# Patient Record
Sex: Male | Born: 1960 | Race: Black or African American | Hispanic: No | Marital: Married | Smoking: Never smoker
Health system: Southern US, Community
[De-identification: ages and names within clinical notes are randomized; demographics above are authoritative.]

## PROBLEM LIST (undated history)

## (undated) DIAGNOSIS — E119 Type 2 diabetes mellitus without complications: Secondary | ICD-10-CM

---

## 2019-04-24 ENCOUNTER — Encounter: Payer: Self-pay | Admitting: Emergency Medicine

## 2019-04-24 ENCOUNTER — Emergency Department
Admission: EM | Admit: 2019-04-24 | Discharge: 2019-04-24 | Disposition: A | Discharge status: Home / Self Care | Payer: Medicaid Other | Attending: Emergency Medicine | Admitting: Emergency Medicine

## 2019-04-24 DIAGNOSIS — R519 Headache, unspecified: Secondary | ICD-10-CM | POA: Diagnosis not present

## 2019-04-24 DIAGNOSIS — R42 Dizziness and giddiness: Secondary | ICD-10-CM | POA: Insufficient documentation

## 2019-04-24 DIAGNOSIS — Z760 Encounter for issue of repeat prescription: Secondary | ICD-10-CM

## 2019-04-24 DIAGNOSIS — E119 Type 2 diabetes mellitus without complications: Secondary | ICD-10-CM | POA: Diagnosis not present

## 2019-04-24 HISTORY — DX: Type 2 diabetes mellitus without complications: E11.9

## 2019-04-24 LAB — BASIC METABOLIC PANEL
Anion gap: 7 (ref 5–15)
BUN: 20 mg/dL (ref 6–20)
CO2: 24 mmol/L (ref 22–32)
Calcium: 9.1 mg/dL (ref 8.9–10.3)
Chloride: 107 mmol/L (ref 98–111)
Creatinine, Ser: 0.94 mg/dL (ref 0.61–1.24)
GFR calc Af Amer: >60 mL/min (ref >60)
GFR calc non Af Amer: >60 mL/min (ref >60)
Glucose, Bld: 274 mg/dL — ABNORMAL HIGH (ref 70–99)
Potassium: 4.2 mmol/L (ref 3.5–5.1)
Sodium: 138 mmol/L (ref 135–145)

## 2019-04-24 LAB — CBC
HCT: 41.8 % (ref 39.0–52.0)
Hemoglobin: 13.8 g/dL (ref 13.0–17.0)
MCH: 29.6 pg (ref 26.0–34.0)
MCHC: 33 g/dL (ref 30.0–36.0)
MCV: 89.7 fL (ref 80.0–100.0)
Platelets: 291 10*3/uL (ref 150–400)
RBC: 4.66 MIL/uL (ref 4.22–5.81)
RDW: 12.5 % (ref 11.5–15.5)
WBC: 5.5 10*3/uL (ref 4.0–10.5)
nRBC: 0 % (ref 0.0–0.2)

## 2019-04-24 MED ORDER — KETOROLAC TROMETHAMINE 30 MG/ML IJ SOLN
30.0000 mg | Freq: Once | INTRAMUSCULAR | Status: AC
  Administered 2019-04-24: 30 mg via INTRAVENOUS
  Filled 2019-04-24: qty 1

## 2019-04-24 MED ORDER — INSULIN LISPRO 100 UNIT/ML ~~LOC~~ SOLN: 20.0000 [IU] | Freq: Three times a day (TID) | SUBCUTANEOUS | 2 refills | Status: AC

## 2019-04-24 MED ORDER — SODIUM CHLORIDE 0.9 % IV SOLN
Freq: Once | INTRAVENOUS | Status: AC
  Administered 2019-04-24: 09:00:00 via INTRAVENOUS

## 2019-04-24 MED ORDER — METOCLOPRAMIDE HCL 5 MG/ML IJ SOLN
10.0000 mg | Freq: Once | INTRAMUSCULAR | Status: AC
  Administered 2019-04-24: 10 mg via INTRAVENOUS
  Filled 2019-04-24: qty 2

## 2019-04-24 MED ORDER — BASAGLAR KWIKPEN 100 UNIT/ML ~~LOC~~ SOPN: 20.0000 [IU] | PEN_INJECTOR | Freq: Two times a day (BID) | SUBCUTANEOUS | 2 refills | Status: AC

## 2019-04-24 MED ORDER — DORZOLAMIDE HCL-TIMOLOL MAL 2-0.5 % OP SOLN: 1.0000 [drp] | Freq: Three times a day (TID) | OPHTHALMIC | 1 refills | Status: AC

## 2019-04-24 MED ORDER — ATORVASTATIN CALCIUM 40 MG PO TABS: 40.0000 mg | ORAL_TABLET | Freq: Every day | ORAL | 1 refills | Status: AC

## 2019-04-24 MED ORDER — DIPHENHYDRAMINE HCL 50 MG/ML IJ SOLN
25.0000 mg | Freq: Once | INTRAMUSCULAR | Status: AC
  Administered 2019-04-24: 25 mg via INTRAVENOUS
  Filled 2019-04-24: qty 1

## 2019-04-24 MED ORDER — OMEPRAZOLE 20 MG PO CPDR: 20.0000 mg | DELAYED_RELEASE_CAPSULE | Freq: Every day | ORAL | 1 refills | Status: AC

## 2019-04-24 MED ORDER — TAMSULOSIN HCL 0.4 MG PO CAPS: 0.4000 mg | ORAL_CAPSULE | Freq: Every day | ORAL | 2 refills | Status: AC

## 2019-04-24 MED ORDER — LISINOPRIL-HYDROCHLOROTHIAZIDE 20-25 MG PO TABS: 1.0000 | ORAL_TABLET | Freq: Every day | ORAL | 2 refills | Status: AC

## 2019-04-24 NOTE — ED Notes (Signed)
Re-collect of green top tube , sent to lab

## 2019-04-24 NOTE — ED Triage Notes (Signed)
Pt from home via EMS with c/o dizziness and headache. Hx of diabetes,  cbg 227. Pt is A&OX4, appears fatigue.

## 2019-04-24 NOTE — ED Provider Notes (Signed)
[facility] Emergency Department Provider Note       Time seen: ----------------------------------------- 8:14 AM on 04/24/2019 -----------------------------------------   I have reviewed the triage vital signs and the nursing notes.  HISTORY   Chief Complaint Dizziness   HPI Lawrence Buchanan is a 58 y.o. male with a history of diabetes who presents to the ED for dizziness and headache.  Patient reportedly has 10 out of 10 headache.  Patient states he stopped taking his medications for almost a week, then took them all last night.  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  He is complaining of headache and dizziness, feels weak and lightheaded.  Blood sugar of 227 here is low for him he states.  Past Medical History:  Diagnosis Date  . Diabetes mellitus without complication ([place])     There are no active problems to display for this patient.   History reviewed. No pertinent surgical history.  Allergies Patient has no known allergies.  Social History Social History   Tobacco Use  . Smoking status: Never Smoker  . Smokeless tobacco: Never Used  Substance Use Topics  . Alcohol use: Not Currently  . Drug use: Not Currently   Review of Systems Constitutional: Negative for fever. Cardiovascular: Negative for chest pain. Respiratory: Negative for shortness of breath. Gastrointestinal: Negative for abdominal pain, vomiting and diarrhea. Musculoskeletal: Negative for back pain. Skin: Negative for rash. Neurological: Positive for headache and weakness  All systems negative/normal/unremarkable except as stated in the HPI  ____________________________________________   PHYSICAL EXAM:  VITAL SIGNS: ED Triage Vitals  Enc Vitals Group     BP 04/24/19 0718 (!) 156/85     Pulse Rate 04/24/19 0718 62     Resp 04/24/19 0718 16     Temp 04/24/19 0718 98 F (36.7 C)     Temp Source 04/24/19 0718 Oral     SpO2  04/24/19 0718 100 %     Weight --      Height --      Head Circumference --      Peak Flow --      Pain Score 04/24/19 0716 10     Pain Loc --      Pain Edu? --      Excl. in [place]? --    Constitutional: Alert and oriented. Well appearing and in no distress. Eyes: Conjunctivae are normal. Normal extraocular movements. ENT      Head: Normocephalic and atraumatic.      Nose: No congestion/rhinnorhea.      Mouth/Throat: Mucous membranes are moist.      Neck: No stridor. Cardiovascular: Normal rate, regular rhythm. No murmurs, rubs, or gallops. Respiratory: Normal respiratory effort without tachypnea nor retractions. Breath sounds are clear and equal bilaterally. No wheezes/rales/rhonchi. Gastrointestinal: Soft and nontender. Normal bowel sounds Musculoskeletal: Nontender with normal range of motion in extremities. No lower extremity tenderness nor edema. Neurologic:  Normal speech and language. No gross focal neurologic deficits are appreciated.  Generalized weakness, nothing focal Skin:  Skin is warm, dry and intact. No rash noted. Psychiatric: Mood and affect are normal. Speech and behavior are normal.  ____________________________________________  EKG: Interpreted by me.  Sinus rhythm rate of 62 bpm, normal PR interval, normal QRS, normal QT  ____________________________________________  ED COURSE:  As part of my medical decision making, I reviewed the following data within the [facility] History obtained from family if  available, nursing notes, old chart and ekg, as well as notes from prior ED visits. Patient presented for dizziness and headache, we will assess with labs as indicated at this time.   Procedures  Onie Hayashi was evaluated in Emergency Department on 04/24/2019 for the symptoms described in the history of present illness. He was evaluated in the context of the global COVID-19 pandemic, which necessitated consideration that the patient might be at risk  for infection with the SARS-CoV-2 virus that causes COVID-19. Institutional protocols and algorithms that pertain to the evaluation of patients at risk for COVID-19 are in a state of rapid change based on information released by regulatory bodies including the CDC and federal and state organizations. These policies and algorithms were followed during the patient's care in the ED.  ____________________________________________   LABS (pertinent positives/negatives)  Labs Reviewed  BASIC METABOLIC PANEL - Abnormal; Notable for the following components:      Result Value   Glucose, Bld 274 (*)    All other components within normal limits  CBC  URINALYSIS, COMPLETE (UACMP) WITH MICROSCOPIC  CBG MONITORING, ED   ____________________________________________   DIFFERENTIAL DIAGNOSIS   Dehydration, electrolyte abnormality, hyperglycemia, hypoglycemia, renal failure, migraine, tension headache, subarachnoid hemorrhage  FINAL ASSESSMENT AND PLAN  Dizziness, headache   Plan: The patient had presented for dizziness and headache. Patient's labs did not reveal any acute process.  Blood sugar was 274.  Headache resolved with conservative management and he felt better.  Unfortunately he has run out of most of his medications and we will restart his medications and try to refill them.  He is cleared for outpatient follow-up.   Ulice Dash, MD    Note: This note was generated in part or whole with voice recognition software. Voice recognition is usually quite accurate but there are transcription errors that can and very often do occur. I apologize for any typographical errors that were not detected and corrected.     Emily Filbert, MD 04/24/19 1136

## 2019-11-22 ENCOUNTER — Ambulatory Visit: Payer: Medicaid Other | Admitting: Podiatry

## 2019-11-26 ENCOUNTER — Ambulatory Visit: Payer: Medicaid Other | Admitting: Podiatry

## 2019-11-26 ENCOUNTER — Other Ambulatory Visit: Payer: Self-pay

## 2019-11-26 ENCOUNTER — Encounter: Payer: Self-pay | Admitting: Podiatry

## 2019-11-26 ENCOUNTER — Encounter: Payer: Self-pay | Admitting: *Deleted

## 2019-11-26 DIAGNOSIS — E119 Type 2 diabetes mellitus without complications: Secondary | ICD-10-CM | POA: Insufficient documentation

## 2019-11-26 DIAGNOSIS — M2041 Other hammer toe(s) (acquired), right foot: Secondary | ICD-10-CM | POA: Diagnosis not present

## 2019-11-26 DIAGNOSIS — E1140 Type 2 diabetes mellitus with diabetic neuropathy, unspecified: Secondary | ICD-10-CM | POA: Diagnosis not present

## 2019-11-26 NOTE — Progress Notes (Addendum)
This patient presents to the office with chief complaint of  diabetic feet.  This patient  says there  is tingling  in his  feet.  Patient presents to the office with his wife for annual foot exam..  Patient has no history of infection or drainage from both feet.   This patient presents  to the office today for evaluation of his feet due to diabetes.   General Appearance  Alert, conversant and in no acute stress.  Vascular  Dorsalis pedis and posterior tibial  pulses are palpable  bilaterally.  Capillary return is within normal limits  bilaterally. Temperature is within normal limits  bilaterally.  Neurologic  Senn-Weinstein monofilament wire test within normal limits  bilaterally. Muscle power within normal limits bilaterally.  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  Orthopedic  No limitations of motion of motion feet .  No crepitus or effusions noted.  Pes planus foot type  B/L  Hallux limitus 1st MPJ  B/L.  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  Skin  normotropic skin with no porokeratosis noted bilaterally.  No signs of infections or ulcers noted.       Diabetes with no foot complications  IE    A diabetic foot exam was performed and there is no evidence of any vascular or neurologic pathology.  Told him about his nail dystrophy and told him to d/c lamisil until nail studies are done. Discussed his hallux limitus 1st MPJ  B/L with hammer toes.   RTC 1 year for annual diabetic exam.   Helane Gunther DPM

## 2020-01-30 DIAGNOSIS — Z0000 Encounter for general adult medical examination without abnormal findings: Secondary | ICD-10-CM | POA: Insufficient documentation

## 2020-01-30 DIAGNOSIS — B351 Tinea unguium: Secondary | ICD-10-CM | POA: Insufficient documentation

## 2020-01-30 DIAGNOSIS — H543 Unqualified visual loss, both eyes: Secondary | ICD-10-CM | POA: Insufficient documentation

## 2020-01-30 DIAGNOSIS — S3994XA Unspecified injury of external genitals, initial encounter: Secondary | ICD-10-CM | POA: Insufficient documentation

## 2020-01-30 DIAGNOSIS — Z6831 Body mass index (BMI) 31.0-31.9, adult: Secondary | ICD-10-CM | POA: Insufficient documentation

## 2020-03-10 ENCOUNTER — Other Ambulatory Visit: Payer: Self-pay | Admitting: Emergency Medicine

## 2020-03-13 ENCOUNTER — Other Ambulatory Visit: Payer: Self-pay | Admitting: Emergency Medicine

## 2020-04-18 ENCOUNTER — Other Ambulatory Visit: Payer: Self-pay

## 2020-04-18 ENCOUNTER — Ambulatory Visit: Payer: Medicaid Other | Admitting: Urology

## 2020-06-30 ENCOUNTER — Other Ambulatory Visit: Payer: Self-pay

## 2020-06-30 ENCOUNTER — Encounter: Payer: Self-pay | Admitting: Emergency Medicine

## 2020-06-30 ENCOUNTER — Emergency Department: Payer: Medicaid Other

## 2020-06-30 DIAGNOSIS — R079 Chest pain, unspecified: Secondary | ICD-10-CM | POA: Insufficient documentation

## 2020-06-30 DIAGNOSIS — Z5321 Procedure and treatment not carried out due to patient leaving prior to being seen by health care provider: Secondary | ICD-10-CM | POA: Insufficient documentation

## 2020-06-30 DIAGNOSIS — R0602 Shortness of breath: Secondary | ICD-10-CM | POA: Diagnosis not present

## 2020-06-30 NOTE — ED Triage Notes (Signed)
Pt to triage via w/c with no distress noted; pt reports mid CP tonight, nonradiating accomp by [facility]

## 2020-07-01 ENCOUNTER — Emergency Department
Admission: EM | Admit: 2020-07-01 | Discharge: 2020-07-01 | Disposition: A | Discharge status: Home / Self Care | Payer: Medicaid Other | Attending: Emergency Medicine | Admitting: Emergency Medicine

## 2020-07-01 LAB — CBC WITH DIFFERENTIAL/PLATELET
Abs Immature Granulocytes: 0.06 10*3/uL (ref 0.00–0.07)
Basophils Absolute: 0 10*3/uL (ref 0.0–0.1)
Basophils Relative: 0 %
Eosinophils Absolute: 0.2 10*3/uL (ref 0.0–0.5)
Eosinophils Relative: 1 %
HCT: 49.4 % (ref 39.0–52.0)
Hemoglobin: 16.8 g/dL (ref 13.0–17.0)
Immature Granulocytes: 1 %
Lymphocytes Relative: 75 %
Lymphs Abs: 9.7 10*3/uL — ABNORMAL HIGH (ref 0.7–4.0)
MCH: 29.5 pg (ref 26.0–34.0)
MCHC: 34 g/dL (ref 30.0–36.0)
MCV: 86.8 fL (ref 80.0–100.0)
Monocytes Absolute: 0.9 10*3/uL (ref 0.1–1.0)
Monocytes Relative: 7 %
Neutro Abs: 2.1 10*3/uL (ref 1.7–7.7)
Neutrophils Relative %: 16 %
Platelets: 413 10*3/uL — ABNORMAL HIGH (ref 150–400)
RBC: 5.69 MIL/uL (ref 4.22–5.81)
RDW: 11.9 % (ref 11.5–15.5)
Smear Review: NORMAL
WBC: 13 10*3/uL — ABNORMAL HIGH (ref 4.0–10.5)
nRBC: 0 % (ref 0.0–0.2)

## 2020-07-01 LAB — COMPREHENSIVE METABOLIC PANEL
ALT: 42 U/L (ref 0–44)
AST: 33 U/L (ref 15–41)
Albumin: 4.1 g/dL (ref 3.5–5.0)
Alkaline Phosphatase: 136 U/L — ABNORMAL HIGH (ref 38–126)
Anion gap: 16 — ABNORMAL HIGH (ref 5–15)
BUN: 25 mg/dL — ABNORMAL HIGH (ref 6–20)
CO2: 20 mmol/L — ABNORMAL LOW (ref 22–32)
Calcium: 9.7 mg/dL (ref 8.9–10.3)
Chloride: 103 mmol/L (ref 98–111)
Creatinine, Ser: 1.39 mg/dL — ABNORMAL HIGH (ref 0.61–1.24)
GFR, Estimated: 59 mL/min — ABNORMAL LOW (ref >60)
Glucose, Bld: 88 mg/dL (ref 70–99)
Potassium: 3.6 mmol/L (ref 3.5–5.1)
Sodium: 139 mmol/L (ref 135–145)
Total Bilirubin: 0.6 mg/dL (ref 0.3–1.2)
Total Protein: 8.4 g/dL — ABNORMAL HIGH (ref 6.5–8.1)

## 2020-07-01 LAB — TROPONIN I (HIGH SENSITIVITY): Troponin I (High Sensitivity): 11 ng/L (ref <18)

## 2020-07-01 LAB — PATHOLOGIST SMEAR REVIEW

## 2020-08-01 ENCOUNTER — Ambulatory Visit
Admission: RE | Admit: 2020-08-01 | Discharge: 2020-08-01 | Disposition: A | Discharge status: Home / Self Care | Payer: Disability Insurance | Attending: Family Medicine | Admitting: Family Medicine

## 2020-08-01 ENCOUNTER — Other Ambulatory Visit: Payer: Self-pay | Admitting: Family Medicine

## 2020-08-01 ENCOUNTER — Ambulatory Visit
Admission: RE | Admit: 2020-08-01 | Discharge: 2020-08-01 | Disposition: A | Discharge status: Home / Self Care | Payer: Disability Insurance | Source: Ambulatory Visit | Attending: Family Medicine | Admitting: Family Medicine

## 2020-08-01 DIAGNOSIS — M1990 Unspecified osteoarthritis, unspecified site: Secondary | ICD-10-CM

## 2020-10-21 ENCOUNTER — Emergency Department
Admission: EM | Admit: 2020-10-21 | Discharge: 2020-10-22 | Disposition: A | Discharge status: Home / Self Care | Payer: Medicaid Other | Attending: Emergency Medicine | Admitting: Emergency Medicine

## 2020-10-21 ENCOUNTER — Other Ambulatory Visit: Payer: Self-pay

## 2020-10-21 ENCOUNTER — Emergency Department: Payer: Medicaid Other

## 2020-10-21 ENCOUNTER — Encounter: Payer: Self-pay | Admitting: Emergency Medicine

## 2020-10-21 ENCOUNTER — Ambulatory Visit
Admission: EM | Admit: 2020-10-21 | Discharge: 2020-10-21 | Disposition: A | Discharge status: Home / Self Care | Payer: Medicaid Other | Attending: Sports Medicine | Admitting: Sports Medicine

## 2020-10-21 DIAGNOSIS — Z20822 Contact with and (suspected) exposure to covid-19: Secondary | ICD-10-CM | POA: Insufficient documentation

## 2020-10-21 DIAGNOSIS — R1084 Generalized abdominal pain: Secondary | ICD-10-CM | POA: Insufficient documentation

## 2020-10-21 DIAGNOSIS — R55 Syncope and collapse: Secondary | ICD-10-CM | POA: Insufficient documentation

## 2020-10-21 DIAGNOSIS — R45851 Suicidal ideations: Secondary | ICD-10-CM | POA: Diagnosis not present

## 2020-10-21 DIAGNOSIS — R059 Cough, unspecified: Secondary | ICD-10-CM | POA: Diagnosis not present

## 2020-10-21 DIAGNOSIS — Z79899 Other long term (current) drug therapy: Secondary | ICD-10-CM | POA: Diagnosis not present

## 2020-10-21 DIAGNOSIS — R739 Hyperglycemia, unspecified: Secondary | ICD-10-CM

## 2020-10-21 DIAGNOSIS — F321 Major depressive disorder, single episode, moderate: Secondary | ICD-10-CM

## 2020-10-21 DIAGNOSIS — E860 Dehydration: Secondary | ICD-10-CM | POA: Insufficient documentation

## 2020-10-21 DIAGNOSIS — E1165 Type 2 diabetes mellitus with hyperglycemia: Secondary | ICD-10-CM | POA: Insufficient documentation

## 2020-10-21 DIAGNOSIS — R42 Dizziness and giddiness: Secondary | ICD-10-CM | POA: Insufficient documentation

## 2020-10-21 DIAGNOSIS — M62838 Other muscle spasm: Secondary | ICD-10-CM | POA: Diagnosis not present

## 2020-10-21 DIAGNOSIS — F32A Depression, unspecified: Secondary | ICD-10-CM | POA: Insufficient documentation

## 2020-10-21 DIAGNOSIS — R410 Disorientation, unspecified: Secondary | ICD-10-CM | POA: Insufficient documentation

## 2020-10-21 DIAGNOSIS — R519 Headache, unspecified: Secondary | ICD-10-CM | POA: Insufficient documentation

## 2020-10-21 LAB — CBC WITH DIFFERENTIAL/PLATELET
Abs Immature Granulocytes: 0.02 10*3/uL (ref 0.00–0.07)
Basophils Absolute: 0 10*3/uL (ref 0.0–0.1)
Basophils Relative: 0 %
Eosinophils Absolute: 0 10*3/uL (ref 0.0–0.5)
Eosinophils Relative: 0 %
HCT: 45 % (ref 39.0–52.0)
Hemoglobin: 15.7 g/dL (ref 13.0–17.0)
Immature Granulocytes: 0 %
Lymphocytes Relative: 28 %
Lymphs Abs: 2.6 10*3/uL (ref 0.7–4.0)
MCH: 29.5 pg (ref 26.0–34.0)
MCHC: 34.9 g/dL (ref 30.0–36.0)
MCV: 84.6 fL (ref 80.0–100.0)
Monocytes Absolute: 0.5 10*3/uL (ref 0.1–1.0)
Monocytes Relative: 5 %
Neutro Abs: 6.3 10*3/uL (ref 1.7–7.7)
Neutrophils Relative %: 67 %
Platelets: 323 10*3/uL (ref 150–400)
RBC: 5.32 MIL/uL (ref 4.22–5.81)
RDW: 12.5 % (ref 11.5–15.5)
WBC: 9.4 10*3/uL (ref 4.0–10.5)
nRBC: 0 % (ref 0.0–0.2)

## 2020-10-21 LAB — GLUCOSE, CAPILLARY: Glucose-Capillary: 181 mg/dL — ABNORMAL HIGH (ref 70–99)

## 2020-10-21 LAB — URINALYSIS, COMPLETE (UACMP) WITH MICROSCOPIC
Bacteria, UA: NONE SEEN
Bilirubin Urine: NEGATIVE
Glucose, UA: >=500 mg/dL — AB
Hgb urine dipstick: NEGATIVE
Ketones, ur: 5 mg/dL — AB
Leukocytes,Ua: NEGATIVE
Nitrite: NEGATIVE
Protein, ur: NEGATIVE mg/dL
Specific Gravity, Urine: 1.022 (ref 1.005–1.030)
Squamous Epithelial / HPF: NONE SEEN (ref 0–5)
pH: 5 (ref 5.0–8.0)

## 2020-10-21 LAB — ETHANOL: Alcohol, Ethyl (B): <10 mg/dL (ref <10)

## 2020-10-21 LAB — COMPREHENSIVE METABOLIC PANEL
ALT: 30 U/L (ref 0–44)
AST: 20 U/L (ref 15–41)
Albumin: 4.4 g/dL (ref 3.5–5.0)
Alkaline Phosphatase: 82 U/L (ref 38–126)
Anion gap: 12 (ref 5–15)
BUN: 31 mg/dL — ABNORMAL HIGH (ref 6–20)
CO2: 19 mmol/L — ABNORMAL LOW (ref 22–32)
Calcium: 9.7 mg/dL (ref 8.9–10.3)
Chloride: 105 mmol/L (ref 98–111)
Creatinine, Ser: 1.32 mg/dL — ABNORMAL HIGH (ref 0.61–1.24)
GFR, Estimated: >60 mL/min (ref >60)
Glucose, Bld: 217 mg/dL — ABNORMAL HIGH (ref 70–99)
Potassium: 3.9 mmol/L (ref 3.5–5.1)
Sodium: 136 mmol/L (ref 135–145)
Total Bilirubin: 0.9 mg/dL (ref 0.3–1.2)
Total Protein: 8.2 g/dL — ABNORMAL HIGH (ref 6.5–8.1)

## 2020-10-21 LAB — TROPONIN I (HIGH SENSITIVITY)
Troponin I (High Sensitivity): 7 ng/L (ref <18)
Troponin I (High Sensitivity): 8 ng/L (ref <18)

## 2020-10-21 LAB — URINE DRUG SCREEN, QUALITATIVE (ARMC ONLY)
Amphetamines, Ur Screen: NOT DETECTED
Barbiturates, Ur Screen: NOT DETECTED
Benzodiazepine, Ur Scrn: NOT DETECTED
Cannabinoid 50 Ng, Ur ~~LOC~~: NOT DETECTED
Cocaine Metabolite,Ur ~~LOC~~: NOT DETECTED
MDMA (Ecstasy)Ur Screen: NOT DETECTED
Methadone Scn, Ur: NOT DETECTED
Opiate, Ur Screen: NOT DETECTED
Phencyclidine (PCP) Ur S: NOT DETECTED
Tricyclic, Ur Screen: NOT DETECTED

## 2020-10-21 LAB — MAGNESIUM: Magnesium: 2.3 mg/dL (ref 1.7–2.4)

## 2020-10-21 LAB — RESP PANEL BY RT-PCR (FLU A&B, COVID) ARPGX2
Influenza A by PCR: NEGATIVE
Influenza B by PCR: NEGATIVE
SARS Coronavirus 2 by RT PCR: NEGATIVE

## 2020-10-21 LAB — SALICYLATE LEVEL: Salicylate Lvl: <7 mg/dL — ABNORMAL LOW (ref 7.0–30.0)

## 2020-10-21 LAB — ACETAMINOPHEN LEVEL: Acetaminophen (Tylenol), Serum: <10 ug/mL — ABNORMAL LOW (ref 10–30)

## 2020-10-21 LAB — CBG MONITORING, ED
Glucose-Capillary: 274 mg/dL — ABNORMAL HIGH (ref 70–99)
Glucose-Capillary: 306 mg/dL — ABNORMAL HIGH (ref 70–99)

## 2020-10-21 LAB — CK: Total CK: 157 U/L (ref 49–397)

## 2020-10-21 LAB — HEMOGLOBIN A1C
Hgb A1c MFr Bld: 13.8 % — ABNORMAL HIGH (ref 4.8–5.6)
Mean Plasma Glucose: 349.36 mg/dL

## 2020-10-21 MED ORDER — BRIMONIDINE TARTRATE 0.2 % OP SOLN
1.0000 [drp] | Freq: Three times a day (TID) | OPHTHALMIC | Status: DC
  Administered 2020-10-21 – 2020-10-22 (×2): 1 [drp] via OPHTHALMIC
  Filled 2020-10-21: qty 5

## 2020-10-21 MED ORDER — DORZOLAMIDE HCL-TIMOLOL MAL 2-0.5 % OP SOLN
1.0000 [drp] | Freq: Three times a day (TID) | OPHTHALMIC | Status: DC
  Administered 2020-10-21 – 2020-10-22 (×2): 1 [drp] via OPHTHALMIC
  Filled 2020-10-21: qty 10

## 2020-10-21 MED ORDER — LISINOPRIL-HYDROCHLOROTHIAZIDE 20-25 MG PO TABS: 1.0000 | ORAL_TABLET | Freq: Every day | ORAL | Status: DC

## 2020-10-21 MED ORDER — INSULIN ASPART 100 UNIT/ML IJ SOLN
0.0000 [IU] | INTRAMUSCULAR | Status: DC
  Administered 2020-10-21: 11 [IU] via SUBCUTANEOUS
  Administered 2020-10-21: 8 [IU] via SUBCUTANEOUS
  Administered 2020-10-22 (×2): 5 [IU] via SUBCUTANEOUS
  Administered 2020-10-22: 11 [IU] via SUBCUTANEOUS
  Filled 2020-10-21 (×4): qty 1

## 2020-10-21 MED ORDER — ACETAMINOPHEN 325 MG PO TABS: 650.0000 mg | ORAL_TABLET | ORAL | Status: DC | PRN

## 2020-10-21 MED ORDER — LISINOPRIL 20 MG PO TABS
20.0000 mg | ORAL_TABLET | Freq: Every day | ORAL | Status: DC
  Administered 2020-10-21 – 2020-10-22 (×2): 20 mg via ORAL
  Filled 2020-10-21: qty 1
  Filled 2020-10-21: qty 2

## 2020-10-21 MED ORDER — LACTATED RINGERS IV BOLUS
1000.0000 mL | Freq: Once | INTRAVENOUS | Status: AC
  Administered 2020-10-21: 1000 mL via INTRAVENOUS

## 2020-10-21 MED ORDER — LORAZEPAM 2 MG/ML IJ SOLN
1.0000 mg | Freq: Once | INTRAMUSCULAR | Status: AC
  Administered 2020-10-21: 1 mg via INTRAVENOUS
  Filled 2020-10-21: qty 1

## 2020-10-21 MED ORDER — ATORVASTATIN CALCIUM 20 MG PO TABS
40.0000 mg | ORAL_TABLET | Freq: Every day | ORAL | Status: DC
  Administered 2020-10-21 (×2): 40 mg via ORAL
  Filled 2020-10-21 (×2): qty 2

## 2020-10-21 MED ORDER — HYDROCHLOROTHIAZIDE 25 MG PO TABS
25.0000 mg | ORAL_TABLET | Freq: Every day | ORAL | Status: DC
  Administered 2020-10-22: 25 mg via ORAL
  Filled 2020-10-21: qty 1

## 2020-10-21 MED ORDER — TAMSULOSIN HCL 0.4 MG PO CAPS
0.4000 mg | ORAL_CAPSULE | Freq: Every day | ORAL | Status: DC
  Administered 2020-10-21 – 2020-10-22 (×2): 0.4 mg via ORAL
  Filled 2020-10-21 (×2): qty 1

## 2020-10-21 MED ORDER — PANTOPRAZOLE SODIUM 40 MG PO TBEC
40.0000 mg | DELAYED_RELEASE_TABLET | Freq: Every day | ORAL | Status: DC
  Administered 2020-10-21 – 2020-10-22 (×2): 40 mg via ORAL
  Filled 2020-10-21 (×2): qty 1

## 2020-10-21 MED ORDER — LATANOPROST 0.005 % OP SOLN
1.0000 [drp] | Freq: Every day | OPHTHALMIC | Status: DC
  Administered 2020-10-21: 1 [drp] via OPHTHALMIC
  Filled 2020-10-21: qty 2.5

## 2020-10-21 NOTE — ED Provider Notes (Addendum)
[facility] Emergency Department Provider Note  ____________________________________________   [facility]   [facility] MD Initiated Contact with Patient 10/21/20 1710     (approximate)  I have reviewed the triage vital signs and the nursing notes.   HISTORY  Chief Complaint Abdominal Pain   HPI Lawrence Buchanan is a 60 y.o. male with a past medical history of diabetes who presents accompanied by his wife for assessment of several concerns.  He referred from urgent care.  Patient is a somewhat tangential and poor historian but it seems he started feeling bad over the last couple days with fairly severe abdominal cramps, chest pain, headache, cough, and dizziness.  He thinks he may have passed out a couple times today and while there was initially some concern for possible seizure patient thinks this is less likely and more likely to be passed out.  He has not had any tongue biting or incontinence.  He has no history of seizures.  He is not sure if his blood sugar was low or not.  No seizure-like activity but he was ever witnessed by family or caregivers urgent care.  Patient denies any recent injuries, illicit drug use, tobacco abuse, EtOH use.  Denies any fevers, hemoptysis, shortness of breath, back pain, pain with urination, diarrhea, vomiting, blood in stool or urine does think he has been peeing less than usual.  He states he feels achy and on his arms and legs does not have any focal extremity pain rashes joint swelling or other clear acute extremity pain.  No clear leaving aggravating factors.  Patient states he also feels extremely depressed and is "ready to leave".  He denies taking any substance to harm herself or active suicidal plans.         Past Medical History:  Diagnosis Date  . Diabetes mellitus without complication [facility](HCC)     Patient Active Problem List   Diagnosis Date Noted  . Diabetes (HCC) 11/26/2019  . Diabetes mellitus without complication  (HCC) 11/26/2019    History reviewed. No pertinent surgical history.  Prior to Admission medications   Medication Sig Start Date End Date Taking? Authorizing Provider  atorvastatin (LIPITOR) 40 MG tablet Take 1 tablet (40 mg total) by mouth at bedtime. 04/24/19   Emily FilbertWilliams, Jonathan E, MD  BAYER ASPIRIN EC LOW DOSE 81 MG EC tablet Take by mouth. 06/14/19   [provider]  brimonidine (ALPHAGAN) 0.2 % ophthalmic solution Place 1 drop into both eyes 3 (three) times daily.    [provider]  dorzolamide-timolol (COSOPT) 22.3-6.8 MG/ML ophthalmic solution Place 1 drop into both eyes 3 (three) times daily. 04/24/19   Emily FilbertWilliams, Jonathan E, MD  Insulin Glargine (BASAGLAR KWIKPEN) 100 UNIT/ML SOPN Inject 0.2 mLs (20 Units total) into the skin 2 (two) times daily. 04/24/19   Emily FilbertWilliams, Jonathan E, MD  insulin lispro (ADMELOG) 100 UNIT/ML injection Inject 0.2 mLs (20 Units total) into the skin 3 (three) times daily before meals. (according to sliding scale) 04/24/19   Emily FilbertWilliams, Jonathan E, MD  latanoprost (XALATAN) 0.005 % ophthalmic solution Place 1 drop into both eyes at bedtime.    [provider]  lisinopril-hydrochlorothiazide (ZESTORETIC) 20-25 MG tablet Take 1 tablet by mouth daily. 04/24/19   Emily FilbertWilliams, Jonathan E, MD  NOVOLOG FLEXPEN 100 UNIT/ML FlexPen Inject into the skin. 06/14/19   [provider]  omeprazole (PRILOSEC) 20 MG capsule Take 1 capsule (20 mg total) by mouth daily. 04/24/19   Emily FilbertWilliams, Jonathan E, MD  tamsulosin (FLOMAX) 0.4 MG CAPS capsule Take 1 capsule (0.4 mg total) by mouth daily. 04/24/19   Emily Filbert, MD  terbinafine (LAMISIL) 250 MG tablet Take by mouth. 06/14/19   [provider]    Allergies Patient has no known allergies.  History reviewed. No pertinent family history.  Social History Social History   Tobacco Use  . Smoking status: Never Smoker  . Smokeless tobacco: Never Used  Substance Use Topics  . Alcohol  use: Not Currently  . Drug use: Not Currently    Review of Systems  Review of Systems  Constitutional: Positive for malaise/fatigue. Negative for chills and fever.  HENT: Negative for sore throat.   Eyes: Negative for pain.  Respiratory: Negative for cough and stridor.   Cardiovascular: Positive for chest pain.  Gastrointestinal: Positive for abdominal pain. Negative for vomiting.  Genitourinary: Negative for dysuria.  Musculoskeletal: Positive for myalgias.  Skin: Negative for rash.  Neurological: Positive for dizziness and headaches. Negative for seizures and loss of consciousness.  Psychiatric/Behavioral: Positive for depression. Negative for suicidal ideas.  All other systems reviewed and are negative.     ____________________________________________   PHYSICAL EXAM:  VITAL SIGNS: ED Triage Vitals  Enc Vitals Group     BP      Pulse      Resp      Temp      Temp src      SpO2      Weight      Height      Head Circumference      Peak Flow      Pain Score      Pain Loc      Pain Edu?      Excl. in GC?    Vitals:   10/21/20 1726  BP: (!) 169/90  Pulse: 99  Resp: 20  Temp: 97.8 F (36.6 C)  SpO2: 100%   Physical Exam Vitals and nursing note reviewed.  Constitutional:      Appearance: He is well-developed. He is ill-appearing.  HENT:     Head: Normocephalic and atraumatic.     Right Ear: External ear normal.     Left Ear: External ear normal.     Nose: Nose normal.     Mouth/Throat:     Mouth: Mucous membranes are dry.  Eyes:     Conjunctiva/sclera: Conjunctivae normal.  Cardiovascular:     Rate and Rhythm: Normal rate and regular rhythm.     Heart sounds: No murmur heard.   Pulmonary:     Effort: Pulmonary effort is normal. No respiratory distress.     Breath sounds: Normal breath sounds.  Abdominal:     Palpations: Abdomen is soft.     Tenderness: There is generalized abdominal tenderness. There is no right CVA tenderness or left CVA  tenderness.  Musculoskeletal:     Cervical back: Neck supple.  Skin:    General: Skin is warm and dry.     Capillary Refill: Capillary refill takes 2 to 3 seconds.  Neurological:     Mental Status: He is alert and oriented to person, place, and time.  Psychiatric:        Mood and Affect: Mood is depressed.        Thought Content: Thought content includes suicidal ideation. Thought content does not include homicidal ideation. Thought content does not include homicidal or suicidal plan.     Cranial nerves II through XII grossly intact.  No pronator drift.  No finger dysmetria.  Symmetric 5/5 strength of all extremities.  Sensation intact to light touch in all extremities.    ____________________________________________   LABS (all labs ordered are listed, but only abnormal results are displayed)  Labs Reviewed  COMPREHENSIVE METABOLIC PANEL - Abnormal; Notable for the following components:      Result Value   CO2 19 (*)    Glucose, Bld 217 (*)    BUN 31 (*)    Creatinine, Ser 1.32 (*)    Total Protein 8.2 (*)    All other components within normal limits  URINALYSIS, COMPLETE (UACMP) WITH MICROSCOPIC - Abnormal; Notable for the following components:   Color, Urine YELLOW (*)    APPearance CLEAR (*)    Glucose, UA >=500 (*)    Ketones, ur 5 (*)    All other components within normal limits  HEMOGLOBIN A1C - Abnormal; Notable for the following components:   Hgb A1c MFr Bld 13.8 (*)    All other components within normal limits  RESP PANEL BY RT-PCR (FLU A&B, COVID) ARPGX2  CBC WITH DIFFERENTIAL/PLATELET  MAGNESIUM  URINE DRUG SCREEN, QUALITATIVE ([facility] ONLY)  ACETAMINOPHEN LEVEL  SALICYLATE LEVEL  ETHANOL  CK  TROPONIN I (HIGH SENSITIVITY)  TROPONIN I (HIGH SENSITIVITY)   ____________________________________________  EKG  Sinus rhythm with ventricular of 96, unremarkable intervals without clearance of acute ischemia or  significant Arrhythmia. ____________________________________________  RADIOLOGY  ED MD interpretation: Chest x-ray has no focal consolidation, effusion, significant edema, pneumothorax or any other clear acute thoracic process.  CT head shows no acute intracranial process.  Very remote left lamina papyracea fracture.  CT abdomen pelvis remarkable for some circumferential bladder wall thickening  Official radiology report(s): CT ABDOMEN PELVIS WO CONTRAST  Result Date: 10/21/2020 CLINICAL DATA:  Reported seizures, flushing abdomen, history of type 1 diabetes EXAM: CT ABDOMEN AND PELVIS WITHOUT CONTRAST TECHNIQUE: Multidetector CT imaging of the abdomen and pelvis was performed following the standard protocol without IV contrast. COMPARISON:  None. FINDINGS: Lower chest: Lung bases are clear. Normal heart size. No pericardial effusion. Hepatobiliary: No worrisome focal liver abnormality is seen. Normal gallbladder. No visible calcified gallstones. No biliary ductal dilatation. Pancreas: No pancreatic ductal dilatation or surrounding inflammatory changes. Spleen: Normal in size. No concerning splenic lesions. Adrenals/Urinary Tract: Normal adrenals. Kidneys are symmetric in size and normally located. Nonobstructive 3 mm calculus in the upper pole left kidney (5/60). No obstructive urolithiasis or hydronephrosis. No visible or contour deforming renal lesions. Circumferential bladder wall thickening. No visible bladder calculi or debris. Indentation of bladder base by an enlarged prostate. Stomach/Bowel: Distal esophagus, stomach and duodenal sweep are unremarkable. No small bowel wall thickening or dilatation. No evidence of obstruction. A normal appendix is visualized. No colonic dilatation or wall thickening. Vascular/Lymphatic: Atherosclerotic calcifications within the abdominal aorta and branch vessels. No aneurysm or ectasia. No enlarged abdominopelvic lymph nodes. Reproductive: Mild prostatomegaly with  few benign-appearing eccentric calcifications. Seminal vesicles are unremarkable. Included external genitalia are free of acute abnormality. Other: No abdominopelvic free fluid or free gas. No bowel containing hernias. Tiny fat containing umbilical hernia. Musculoskeletal: Multilevel degenerative changes are present in the imaged portions of the spine. Stepwise retrolisthesis L3-L5 without spondylolysis. Musculature is normal and symmetric. IMPRESSION: 1. Circumferential bladder wall thickening, possibly related to outlet obstruction given enlarged prostate with indentation of the bladder base. Though recommend correlation with urinalysis to exclude a cystitis. 2. Punctate nonobstructing calculus in the upper pole left kidney. 3. No other acute CT evident abdominopelvic  abnormality. Electronically Signed   By: Kreg Shropshire M.D.   On: 10/21/2020 18:19   CT Head Wo Contrast  Result Date: 10/21/2020 CLINICAL DATA:  Wife states patient has had 3 seizures today, clenching abdomen, history of type 1 diabetes EXAM: CT HEAD WITHOUT CONTRAST TECHNIQUE: Contiguous axial images were obtained from the base of the skull through the vertex without intravenous contrast. COMPARISON:  None. FINDINGS: Brain: No evidence of acute infarction, hemorrhage, hydrocephalus, extra-axial collection, visible mass lesion or mass effect. Vascular: No hyperdense vessel or unexpected calcification. Skull: No calvarial fracture or suspicious osseous lesion. No scalp swelling or hematoma. Remote appearing left lamina papyracea fracture. Sinuses/Orbits: Paranasal sinuses and mastoid air cells are predominantly clear. Middle ear cavities are clear. Orbital structures are unremarkable aside from prior lens extractions. Other: None. IMPRESSION: No acute intracranial abnormality. Remote appearing left lamina papyracea fracture. Electronically Signed   By: Kreg Shropshire M.D.   On: 10/21/2020 18:14   DG Chest Portable 1 View  Result Date:  10/21/2020 CLINICAL DATA:  Seizures. EXAM: PORTABLE CHEST 1 VIEW COMPARISON:  06/30/2020 FINDINGS: The cardiac silhouette, mediastinal and hilar contours are within normal limits given the AP projection and portable technique. The lungs are clear of an acute process. No pleural effusions or pneumothorax. The bony thorax is intact. IMPRESSION: No acute cardiopulmonary findings. Electronically Signed   By: Rudie Meyer M.D.   On: 10/21/2020 18:00    ____________________________________________   PROCEDURES  Procedure(s) performed (including Critical Care):  .1-3 Lead EKG Interpretation Performed by: Gilles Chiquito, MD Authorized by: Gilles Chiquito, MD     Interpretation: normal     ECG rate assessment: normal     Rhythm: sinus rhythm     Ectopy: none     Conduction: normal       ____________________________________________   INITIAL IMPRESSION / ASSESSMENT AND PLAN / ED COURSE      Patient presents with above-stated history and exam for assessment of multiple concerns including possible several passing out episodes today versus seizure in the setting of couple days of headache, dizziness, chest pain, abdominal pain, myalgias and fatigue.  Patient also endorses severe depression make several statements saying "I am ready to go its my time".  Denies any active plan to harm self or doing anything to harm self prior to arrival.  He is somewhat tangential historian but denies any other associated symptoms or recent illnesses.  Regard to his dizziness headache chest pain abdominal pain and myalgias differential includes acute infectious process, metabolic derangements, arrhythmias,, toxic ingestion possible seizure.  Upon initial exam patient seemed to have some spasming of his abdomen on abdominal exam following some fluids and Ativan he stated his abdominal pain has completely resolved.  His abdomen was soft and nontender throughout.  In addition he had a completely nonfocal supine  neuro exam.  CT head shows no evidence of clear acute intracranial process.  No focal deficits to suggest CVA.  No findings on history or exam to suggest deep space infection in head or neck.    With regard to patient's reported chest pain and cardiology at this time.  He was quite hypertensive on arrival although certainly possible he had some symptomatic hypertensive urgency although given resolution of discomfort with some IV fluids and antispasmodic without any other clear historical or exam features suggestive of dissection at this time.  He has no hypoxia, tachypnea, or risk factors for PE and overall Evalose patient for PE at this time.  Chest x-ray shows no evidence of pneumonia, thorax, effusion edema or other clear acute intrathoracic process.  ECG is no clear evidence of acute ischemia and given nonelevated troponin obtained greater than 3 hours after symptom onset I have a low suspicion for ACS or myocarditis contributing to patient's chest pain.  CBC shows no leukocytosis or acute anemia.  CMP shows bicarb of 19 and glucose of 217 with normal anion gap without any other significant electrolyte or metabolic derangements.  Magnesium is WNL.  Unclear source of possible syncope although certainly possible this was witnessed dehydration.  With regard to patient's abdominal pain and swelling possible secondary to spasm no symptoms bending my initial assessment.  He also reports some urinary hesitancy and has some thickening of his bladder suggestive of some chronic urinary retention but he is able to urinate without significant difficulty emergency room.  UA does not appear infected.  CT abdomen pelvis shows no other clear acute abdominal pelvic process i.e. cholecystitis, pancreatitis, diverticulitis or any other clear cause of patient's abdominal symptoms.  Feeling possible he had some spasm related to some dehydration he did appear dehydrated on arrival.  On several reassessments patient stated he  felt much better and denied any back pain, abdominal pain, chest pain and stated headache had resolved.  Given he endorsed fairly severe depression with multiple statements made about passive suicidal thoughts about not wanting to exist anymore and it is time to go psychiatry and TTS consulted.  We will keep patient voluntary at this time as he states he is interested in getting help with his depression.  Remainder of routine psych screening labs undeliverable suspicion for toxic overdose.  The patient has been placed in psychiatric observation due to the need to provide a safe environment for the patient while obtaining psychiatric consultation and evaluation, as well as ongoing medical and medication management to treat the patient's condition.  The patient has not been placed under full IVC at this time.   ____________________________________________   FINAL CLINICAL IMPRESSION(S) / ED DIAGNOSES  Final diagnoses:  Dehydration  Syncope, unspecified syncope type  Muscle spasm  Hyperglycemia  Generalized abdominal pain  Dizziness  Depression, unspecified depression type  Suicidal ideation    Medications  insulin aspart (novoLOG) injection 0-15 Units (8 Units Subcutaneous Given 10/21/20 2106)  atorvastatin (LIPITOR) tablet 40 mg (40 mg Oral Given 10/21/20 2142)  lisinopril (ZESTRIL) tablet 20 mg (has no administration in time range)    And  hydrochlorothiazide (HYDRODIURIL) tablet 25 mg (25 mg Oral Not Given 10/21/20 2136)  acetaminophen (TYLENOL) tablet 650 mg (has no administration in time range)  tamsulosin (FLOMAX) capsule 0.4 mg (has no administration in time range)  dorzolamide-timolol (COSOPT) 22.3-6.8 MG/ML ophthalmic solution 1 drop (has no administration in time range)  brimonidine (ALPHAGAN) 0.2 % ophthalmic solution 1 drop (has no administration in time range)  latanoprost (XALATAN) 0.005 % ophthalmic solution 1 drop (has no administration in time range)  pantoprazole  (PROTONIX) EC tablet 40 mg (has no administration in time range)  lactated ringers bolus 1,000 mL (0 mLs Intravenous Stopped 10/21/20 2058)  LORazepam (ATIVAN) injection 1 mg (1 mg Intravenous Given 10/21/20 1739)     ED Discharge Orders    None       Note:  This document was prepared using Dragon voice recognition software and may include unintentional dictation errors.   Gilles Chiquito, MD 10/21/20 2114    Gilles Chiquito, MD 10/21/20 2150
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
## 2020-10-21 NOTE — ED Triage Notes (Signed)
Pt via EMS from [facility]. Pt was being seen at [facility] for seizure like activity. Pt c/o CP and RUQ abd pain and trouble urinating. Pt is A&Ox4 and NAD.

## 2020-10-21 NOTE — ED Provider Notes (Signed)
[facility]    CSN: [id number] Arrival date & time: 10/21/20  1613      History   Chief Complaint Chief Complaint  Patient presents with  . Altered Mental Status    HPI Lawrence Buchanan is a 60 y.o. male.   Patient is a 60 year old male who I was called to to evaluate as he was being rushed back to one of the examination rooms.  History was difficult to obtain.  Patient had altered mental status and was confused.  He was crying and really not verbal.  The woman who brought him here was registering him and it is believed that she is his wife although she was not forthright with what the relationship was.  The majority of the history was obtained after the fact from the chart.  EMS was called immediately as he was found to have a rigid abdomen and was somewhat combative during the examination.  From the medical chart it appears as though he has a history of gastritis.  He was evaluated at [facility] emergency room in January 2022.  It seems like he had some pressure in his epigastrium and frequent belching.  On my examination he is clutching the upper abdomen but has generalized abdominal pain.  He seems somewhat depressed as he is perseverating on the fact that "it is my time to go".  The lady with him indicates that he lost 2 family members to COVID, and he recently lost his son as well.  He is a type II diabetic and is on insulin.      Past Medical History:  Diagnosis Date  . Diabetes mellitus without complication [facility])     Patient Active Problem List   Diagnosis Date Noted  . Diabetes (HCC) 11/26/2019  . Diabetes mellitus without complication (HCC) 11/26/2019    No past surgical history on file.     Home Medications    Prior to Admission medications   Medication Sig Start Date End Date Taking? Authorizing Provider  atorvastatin (LIPITOR) 40 MG tablet Take 1 tablet (40 mg total) by mouth at bedtime. 04/24/19   Emily Filbert, MD  BAYER ASPIRIN EC LOW DOSE 81 MG  EC tablet Take by mouth. 06/14/19   [provider]  brimonidine (ALPHAGAN) 0.2 % ophthalmic solution Place 1 drop into both eyes 3 (three) times daily.    [provider]  dorzolamide-timolol (COSOPT) 22.3-6.8 MG/ML ophthalmic solution Place 1 drop into both eyes 3 (three) times daily. 04/24/19   Emily Filbert, MD  Insulin Glargine (BASAGLAR KWIKPEN) 100 UNIT/ML SOPN Inject 0.2 mLs (20 Units total) into the skin 2 (two) times daily. 04/24/19   Emily Filbert, MD  insulin lispro (ADMELOG) 100 UNIT/ML injection Inject 0.2 mLs (20 Units total) into the skin 3 (three) times daily before meals. (according to sliding scale) 04/24/19   Emily Filbert, MD  latanoprost (XALATAN) 0.005 % ophthalmic solution Place 1 drop into both eyes at bedtime.    [provider]  lisinopril-hydrochlorothiazide (ZESTORETIC) 20-25 MG tablet Take 1 tablet by mouth daily. 04/24/19   Emily Filbert, MD  NOVOLOG FLEXPEN 100 UNIT/ML FlexPen Inject into the skin. 06/14/19   [provider]  omeprazole (PRILOSEC) 20 MG capsule Take 1 capsule (20 mg total) by mouth daily. 04/24/19   Emily Filbert, MD  tamsulosin (FLOMAX) 0.4 MG CAPS capsule Take 1 capsule (0.4 mg total) by mouth daily. 04/24/19   Emily Filbert, MD  terbinafine (LAMISIL) 250  MG tablet Take by mouth. 06/14/19   [provider]    Family History No family history on file.  Social History Social History   Tobacco Use  . Smoking status: Never Smoker  . Smokeless tobacco: Never Used  Substance Use Topics  . Alcohol use: Not Currently  . Drug use: Not Currently     Allergies   Patient has no known allergies.   Review of Systems Review of Systems  Unable to perform ROS: Acuity of condition     Physical Exam Triage Vital Signs ED Triage Vitals  Enc Vitals Group     BP 10/21/20 1622 (!) 190/124     Pulse Rate 10/21/20 1622 (!) 110     Resp --      Temp 10/21/20 1622 97.8  F (36.6 C)     Temp Source 10/21/20 1622 Temporal     SpO2 10/21/20 1622 100 %     Weight --      Height --      Head Circumference --      Peak Flow --      Pain Score 10/21/20 1624 10     Pain Loc --      Pain Edu? --      Excl. in GC? --    No data found.  Updated Vital Signs BP (!) 190/124 (BP Location: Left Arm)   Pulse (!) 110   Temp 97.8 F (36.6 C) (Temporal)   SpO2 100%   Visual Acuity Right Eye Distance:   Left Eye Distance:   Bilateral Distance:    Right Eye Near:   Left Eye Near:    Bilateral Near:     Physical Exam Vitals and nursing note reviewed.  Constitutional:      General: He is in acute distress.  HENT:     Head: Normocephalic and atraumatic.  Cardiovascular:     Rate and Rhythm: Normal rate and regular rhythm.     Pulses: Normal pulses.     Heart sounds: Normal heart sounds. No murmur heard. No friction rub. No gallop.   Pulmonary:     Effort: Pulmonary effort is normal.     Breath sounds: Normal breath sounds. No stridor. No wheezing, rhonchi or rales.  Abdominal:     General: There is no distension. There are no signs of injury.     Palpations: Abdomen is rigid.     Tenderness: There is generalized abdominal tenderness. There is guarding. There is no right CVA tenderness or left CVA tenderness.  Musculoskeletal:     Cervical back: Normal range of motion and neck supple.  Skin:    General: Skin is warm and dry.     Capillary Refill: Capillary refill takes less than 2 seconds.  Neurological:     Mental Status: He is disoriented.  Psychiatric:        Mood and Affect: Affect is angry and tearful.        Speech: He is noncommunicative.        Behavior: Behavior is agitated, aggressive and combative.      UC Treatments / Results  Labs (all labs ordered are listed, but only abnormal results are displayed) Labs Reviewed  GLUCOSE, CAPILLARY - Abnormal; Notable for the following components:      Result Value   Glucose-Capillary 181  (*)    All other components within normal limits    EKG   Radiology No results found.  Procedures Procedures (including critical care time)  Medications Ordered in UC Medications - No data to display  Initial Impression / Assessment and Plan / UC Course  I have reviewed the triage vital signs and the nursing notes.  Pertinent labs & imaging results that were available during my care of the patient were reviewed by me and considered in my medical decision making (see chart for details).  Clinical impression: Altered mental status with disorientation and confusion.  Patient has generalized diffuse abdominal pain with a fairly rigid abdomen at times.  He is tearful.  Poor historian.  Concern for intra-abdominal pathology.  Treatment plan: 1.  Once the patient was brought back we activated EMS. 2.  Point-of-care blood sugar was obtained and was 181. 3.  Given the abdominal pain I recommended immediate transfer to the emergency room for higher level of care. 4.  Patient was transported via EMS to [facility] for a higher level of care.    Final Clinical Impressions(s) / UC Diagnoses   Final diagnoses:  Disorientation  Confusion  Generalized abdominal pain     Discharge Instructions     Patient transferred acutely via EMS.    ED Prescriptions    None     PDMP not reviewed this encounter.   Delton See, MD 10/21/20 1651

## 2020-10-21 NOTE — Discharge Instructions (Signed)
Patient transferred acutely via EMS.

## 2020-10-21 NOTE — ED Triage Notes (Signed)
Pt wife states he had 3 seizures today. He has no h/o of seizures. Pt is clutching his abdomen. He has h/o type 1 diabetic

## 2020-10-21 NOTE — ED Notes (Signed)
Patient is being discharged from the Urgent Care and sent to the Emergency Department via altered mental status. Per Dr. Zachery Dauer, patient is in need of higher level of care due to altered mental status. Patient is aware and verbalizes understanding of plan of care.  Vitals:   10/21/20 1622  BP: (!) 190/124  Pulse: (!) 110  Temp: 97.8 F (36.6 C)  SpO2: 100%

## 2020-10-22 DIAGNOSIS — F321 Major depressive disorder, single episode, moderate: Secondary | ICD-10-CM

## 2020-10-22 LAB — CBG MONITORING, ED
Glucose-Capillary: 226 mg/dL — ABNORMAL HIGH (ref 70–99)
Glucose-Capillary: 214 mg/dL — ABNORMAL HIGH (ref 70–99)
Glucose-Capillary: 217 mg/dL — ABNORMAL HIGH (ref 70–99)
Glucose-Capillary: 310 mg/dL — ABNORMAL HIGH (ref 70–99)

## 2020-10-22 NOTE — ED Notes (Signed)
Pt's wife is here to visit.  She has brought clothing for the patient and the patient stated he is ready to leave. RN will notify Dr. Toni Amend.

## 2020-10-22 NOTE — ED Notes (Signed)
Pt discharged home. VS stable. Pt denies SI.  Discharge instructions reviewed with patient.  Pt's wife brought clothes from home.

## 2020-10-22 NOTE — Consult Note (Signed)
[facility] Face-to-Face Psychiatry Consult   Reason for Consult: Consult for patient identified with depressive symptoms Referring Physician: Siadecki Patient Identification: Lawrence Buchanan MRN:  [id number] Principal Diagnosis: Episode of moderate major depression (HCC) Diagnosis:  Principal Problem:   Episode of moderate major depression (HCC)   Total Time spent with patient: 1 hour  Subjective:   Lawrence Buchanan is a 60 y.o. male patient admitted with "I have been feeling kind of hopeless".  HPI: Patient seen chart reviewed.  Also spoke with the patient's wife, whom he refers to as his "caretaker".  Also spoke with his niece.  86 year old man presented to the emergency room being referred from urgent care because of abdominal pain and physical symptoms probably related to his poorly controlled diabetes.  During evaluation it was noted that he had altered mental status at 1 point and also was talking about feeling bad and made a comment about "it was my time" which was interpreted as potentially suicidal.  In interview this morning patient acknowledges that his mood stays down and bad most of the time.  He relates this to the fact that several members of his family have died in the past few years including a brother and a sister and a son.  Patient says that he feels hopeless much of the time.  Spends most of his time in bed.  Does not sound like he has much activity.  He denies being aware of any hallucinations.  He denies active suicidal thoughts but says that sometimes he feels like if it were his time he would be ready for it.  Patient has diabetes and high blood pressure and claims to be very concerned about it although it seems unclear whether he is getting Comprehensive Care.  Spoke with his "caretaker" as well who is aware of his diabetes but it seems a bit chaotic what his current care is.  Patient says he is from [place] and has only been in [place] for about 3 or 4 years.  That is a little bit  different than the timeline noted in some other pieces of the chart.  It also seems peculiar that this woman he lives with he continues to refer to as his "caretaker" but she identifies herself as his wife.  Past Psychiatric History: Patient denies having ever seen a mental health provider at all.  Denies ever being prescribed medicine for depression denies any suicide attempts.  Denies alcohol or drug abuse.  Risk to Self:   Risk to Others:   Prior Inpatient Therapy:   Prior Outpatient Therapy:    Past Medical History:  Past Medical History:  Diagnosis Date  . Diabetes mellitus without complication (HCC)    History reviewed. No pertinent surgical history. Family History: History reviewed. No pertinent family history. Family Psychiatric  History: Patient claims there is no family history at all of mental illness.  His niece says exactly the opposite that there is an extensive family history of bipolar disorder. Social History:  Social History   Substance and Sexual Activity  Alcohol Use Not Currently     Social History   Substance and Sexual Activity  Drug Use Not Currently    Social History   Socioeconomic History  . Marital status: Married    Spouse name: Not on file  . Number of children: Not on file  . Years of education: Not on file  . Highest education level: Not on file  Occupational History  . Not on file  Tobacco Use  .  Smoking status: Never Smoker  . Smokeless tobacco: Never Used  Substance and Sexual Activity  . Alcohol use: Not Currently  . Drug use: Not Currently  . Sexual activity: Not on file  Other Topics Concern  . Not on file  Social History Narrative  . Not on file   Social Determinants of Health   Financial Resource Strain: Not on file  Food Insecurity: Not on file  Transportation Needs: Not on file  Physical Activity: Not on file  Stress: Not on file  Social Connections: Not on file   Additional Social History:    Allergies:  No Known  Allergies  Labs:  Results for orders placed or performed during the hospital encounter of 10/21/20 (from the past 48 hour(s))  Resp Panel by RT-PCR (Flu A&B, Covid) Nasopharyngeal Swab     Status: None   Collection Time: 10/21/20  5:29 PM   Specimen: Nasopharyngeal Swab; Nasopharyngeal(NP) swabs in vial transport medium  Result Value Ref Range   SARS Coronavirus 2 by RT PCR NEGATIVE NEGATIVE    Comment: (NOTE) SARS-CoV-2 target nucleic acids are NOT DETECTED.  The SARS-CoV-2 RNA is generally detectable in upper respiratory specimens during the acute phase of infection. The lowest concentration of SARS-CoV-2 viral copies this assay can detect is 138 copies/mL. A negative result does not preclude SARS-Cov-2 infection and should not be used as the sole basis for treatment or other patient management decisions. A negative result may occur with  improper specimen collection/handling, submission of specimen other than nasopharyngeal swab, presence of viral mutation(s) within the areas targeted by this assay, and inadequate number of viral copies(<138 copies/mL). A negative result must be combined with clinical observations, patient history, and epidemiological information. The expected result is Negative.  Fact Sheet for Patients:  BloggerCourse.com  Fact Sheet for Healthcare Providers:  SeriousBroker.it  This test is no t yet approved or cleared by the [place] FDA and  has been authorized for detection and/or diagnosis of SARS-CoV-2 by FDA under an Emergency Use Authorization (EUA). This EUA will remain  in effect (meaning this test can be used) for the duration of the COVID-19 declaration under Section 564(b)(1) of the Act, 21 U.S.C.section 360bbb-3(b)(1), unless the authorization is terminated  or revoked sooner.       Influenza A by PCR NEGATIVE NEGATIVE   Influenza B by PCR NEGATIVE NEGATIVE    Comment: (NOTE) The Xpert  Xpress SARS-CoV-2/FLU/RSV plus assay is intended as an aid in the diagnosis of influenza from Nasopharyngeal swab specimens and should not be used as a sole basis for treatment. Nasal washings and aspirates are unacceptable for Xpert Xpress SARS-CoV-2/FLU/RSV testing.  Fact Sheet for Patients: BloggerCourse.com  Fact Sheet for Healthcare Providers: SeriousBroker.it  This test is not yet approved or cleared by the [place] FDA and has been authorized for detection and/or diagnosis of SARS-CoV-2 by FDA under an Emergency Use Authorization (EUA). This EUA will remain in effect (meaning this test can be used) for the duration of the COVID-19 declaration under Section 564(b)(1) of the Act, 21 U.S.C. section 360bbb-3(b)(1), unless the authorization is terminated or revoked.  Performed at [facility], [address] [place], [place] [address]   CBC with Differential     Status: None   Collection Time: 10/21/20  5:29 PM  Result Value Ref Range   WBC 9.4 4.0 - 10.5 K/uL   RBC 5.32 4.22 - 5.81 MIL/uL   Hemoglobin 15.7 13.0 - 17.0 g/dL   HCT  45.0 39.0 - 52.0 %   MCV 84.6 80.0 - 100.0 fL   MCH 29.5 26.0 - 34.0 pg   MCHC 34.9 30.0 - 36.0 g/dL   RDW 46.6 59.9 - 35.7 %   Platelets 323 150 - 400 K/uL   nRBC 0.0 0.0 - 0.2 %   Neutrophils Relative % 67 %   Neutro Abs 6.3 1.7 - 7.7 K/uL   Lymphocytes Relative 28 %   Lymphs Abs 2.6 0.7 - 4.0 K/uL   Monocytes Relative 5 %   Monocytes Absolute 0.5 0.1 - 1.0 K/uL   Eosinophils Relative 0 %   Eosinophils Absolute 0.0 0.0 - 0.5 K/uL   Basophils Relative 0 %   Basophils Absolute 0.0 0.0 - 0.1 K/uL   Immature Granulocytes 0 %   Abs Immature Granulocytes 0.02 0.00 - 0.07 K/uL    Comment: Performed at [facility], [address] [place], [place] [address]  Comprehensive metabolic panel     Status: Abnormal   Collection Time: 10/21/20  5:29 PM  Result Value Ref Range    Sodium 136 135 - 145 mmol/L   Potassium 3.9 3.5 - 5.1 mmol/L   Chloride 105 98 - 111 mmol/L   CO2 19 (L) 22 - 32 mmol/L   Glucose, Bld 217 (H) 70 - 99 mg/dL    Comment: Glucose reference range applies only to samples taken after fasting for at least 8 hours.   BUN 31 (H) 6 - 20 mg/dL   Creatinine, Ser 3.90 (H) 0.61 - 1.24 mg/dL   Calcium 9.7 8.9 - 30.0 mg/dL   Total Protein 8.2 (H) 6.5 - 8.1 g/dL   Albumin 4.4 3.5 - 5.0 g/dL   AST 20 15 - 41 U/L   ALT 30 0 - 44 U/L   Alkaline Phosphatase 82 38 - 126 U/L   Total Bilirubin 0.9 0.3 - 1.2 mg/dL   GFR, Estimated >92 >33 mL/min    Comment: (NOTE) Calculated using the CKD-EPI Creatinine Equation (2021)    Anion gap 12 5 - 15    Comment: Performed at [facility], [address] [place], [place] [address]  Magnesium     Status: None   Collection Time: 10/21/20  5:29 PM  Result Value Ref Range   Magnesium 2.3 1.7 - 2.4 mg/dL    Comment: Performed at [facility], [address] [place], [place] [address]  Urinalysis, Complete w Microscopic     Status: Abnormal   Collection Time: 10/21/20  5:29 PM  Result Value Ref Range   Color, Urine YELLOW (A) YELLOW   APPearance CLEAR (A) CLEAR   Specific Gravity, Urine 1.022 1.005 - 1.030   pH 5.0 5.0 - 8.0   Glucose, UA >=500 (A) NEGATIVE mg/dL   Hgb urine dipstick NEGATIVE NEGATIVE   Bilirubin Urine NEGATIVE NEGATIVE   Ketones, ur 5 (A) NEGATIVE mg/dL   Protein, ur NEGATIVE NEGATIVE mg/dL   Nitrite NEGATIVE NEGATIVE   Leukocytes,Ua NEGATIVE NEGATIVE   RBC / HPF 0-5 0 - 5 RBC/hpf   WBC, UA 0-5 0 - 5 WBC/hpf   Bacteria, UA NONE SEEN NONE SEEN   Squamous Epithelial / LPF NONE SEEN 0 - 5   Mucus PRESENT    Hyaline Casts, UA PRESENT     Comment: Performed at [facility], [address], [place], [place] [address]  Troponin I (High Sensitivity)     Status: None   Collection Time: 10/21/20  5:29 PM  Result Value Ref Range   Troponin I (High Sensitivity) 7  <18 ng/L    Comment: (NOTE) Elevated high sensitivity troponin I (hsTnI) values and significant  changes across serial measurements may suggest ACS but many other  chronic and acute conditions are known to elevate hsTnI results.  Refer to the "Links" section for chest pain algorithms and additional  guidance. Performed at [facility], [address], [place], [place] [address]   Urine Drug Screen, [facility] only)     Status: None   Collection Time: 10/21/20  5:29 PM  Result Value Ref Range   Tricyclic, Ur Screen NONE DETECTED NONE DETECTED   Amphetamines, Ur Screen NONE DETECTED NONE DETECTED   MDMA (Ecstasy)Ur Screen NONE DETECTED NONE DETECTED   Cocaine Metabolite,Ur [place] NONE DETECTED NONE DETECTED   Opiate, Ur Screen NONE DETECTED NONE DETECTED   Phencyclidine (PCP) Ur S NONE DETECTED NONE DETECTED   Cannabinoid 50 Ng, Ur [place] NONE DETECTED NONE DETECTED   Barbiturates, Ur Screen NONE DETECTED NONE DETECTED   Benzodiazepine, Ur Scrn NONE DETECTED NONE DETECTED   Methadone Scn, Ur NONE DETECTED NONE DETECTED    Comment: (NOTE) Tricyclics + metabolites, urine    Cutoff 1000 ng/mL Amphetamines + metabolites, urine  Cutoff 1000 ng/mL MDMA (Ecstasy), urine              Cutoff 500 ng/mL Cocaine Metabolite, urine          Cutoff 300 ng/mL Opiate + metabolites, urine        Cutoff 300 ng/mL Phencyclidine (PCP), urine         Cutoff 25 ng/mL Cannabinoid, urine                 Cutoff 50 ng/mL Barbiturates + metabolites, urine  Cutoff 200 ng/mL Benzodiazepine, urine              Cutoff 200 ng/mL Methadone, urine                   Cutoff 300 ng/mL  The urine drug screen provides only a preliminary, unconfirmed analytical test result and should not be used for non-medical purposes. Clinical consideration and professional judgment should be applied to any positive drug screen result due to possible interfering substances. A more specific alternate chemical method must be  used in order to obtain a confirmed analytical result. Gas chromatography / mass spectrometry (GC/MS) is the preferred confirm atory method. Performed at [facility], [address] [place], [place] [address]   Hemoglobin A1c     Status: Abnormal   Collection Time: 10/21/20  5:29 PM  Result Value Ref Range   Hgb A1c MFr Bld 13.8 (H) 4.8 - 5.6 %    Comment: (NOTE) Pre diabetes:          5.7%-6.4%  Diabetes:              >6.4%  Glycemic control for   <7.0% adults with diabetes    Mean Plasma Glucose 349.36 mg/dL    Comment: Performed at [facility], [address] [place], [place] [address]  Acetaminophen level     Status: Abnormal   Collection Time: 10/21/20  8:58 PM  Result Value Ref Range   Acetaminophen (Tylenol), Serum <10 (L) 10 - 30 ug/mL    Comment: (NOTE) Therapeutic concentrations vary significantly. A range of 10-30 ug/mL  may be an effective concentration for many patients. However, some  are best treated at concentrations  outside of this range. Acetaminophen concentrations >150 ug/mL at 4 hours after ingestion  and >50 ug/mL at 12 hours after ingestion are often associated with  toxic reactions.  Performed at [facility], [address] [place], [place] [address]   Salicylate level     Status: Abnormal   Collection Time: 10/21/20  8:58 PM  Result Value Ref Range   Salicylate Lvl <7.0 (L) 7.0 - 30.0 mg/dL    Comment: Performed at [facility], [address] [place], [place] [address]  Ethanol     Status: None   Collection Time: 10/21/20  8:58 PM  Result Value Ref Range   Alcohol, Ethyl (B) <10 <10 mg/dL    Comment: (NOTE) Lowest detectable limit for serum alcohol is 10 mg/dL.  For medical purposes only. Performed at [facility], [address] [place], [place] [address]   CK     Status: None   Collection Time: 10/21/20  8:58 PM  Result Value Ref Range   Total CK 157 49 - 397 U/L    Comment:  Performed at [facility], [address] [place], [place] [address]  CBG monitoring, ED     Status: Abnormal   Collection Time: 10/21/20  9:00 PM  Result Value Ref Range   Glucose-Capillary 274 (H) 70 - 99 mg/dL    Comment: Glucose reference range applies only to samples taken after fasting for at least 8 hours.  Troponin I (High Sensitivity)     Status: None   Collection Time: 10/21/20  9:59 PM  Result Value Ref Range   Troponin I (High Sensitivity) 8 <18 ng/L    Comment: (NOTE) Elevated high sensitivity troponin I (hsTnI) values and significant  changes across serial measurements may suggest ACS but many other  chronic and acute conditions are known to elevate hsTnI results.  Refer to the "Links" section for chest pain algorithms and additional  guidance. Performed at [facility], [address] [place], [place] [address]   CBG monitoring, ED     Status: Abnormal   Collection Time: 10/21/20 11:44 PM  Result Value Ref Range   Glucose-Capillary 306 (H) 70 - 99 mg/dL    Comment: Glucose reference range applies only to samples taken after fasting for at least 8 hours.  CBG monitoring, ED     Status: Abnormal   Collection Time: 10/22/20  1:38 AM  Result Value Ref Range   Glucose-Capillary 226 (H) 70 - 99 mg/dL    Comment: Glucose reference range applies only to samples taken after fasting for at least 8 hours.  CBG monitoring, ED     Status: Abnormal   Collection Time: 10/22/20  4:56 AM  Result Value Ref Range   Glucose-Capillary 214 (H) 70 - 99 mg/dL    Comment: Glucose reference range applies only to samples taken after fasting for at least 8 hours.  CBG monitoring, ED     Status: Abnormal   Collection Time: 10/22/20  7:49 AM  Result Value Ref Range   Glucose-Capillary 217 (H) 70 - 99 mg/dL    Comment: Glucose reference range applies only to samples taken after fasting for at least 8 hours.   Comment 1 Notify RN   CBG monitoring, ED     Status: Abnormal    Collection Time: 10/22/20  1:00 PM  Result Value Ref Range   Glucose-Capillary 310 (H) 70 - 99 mg/dL    Comment: Glucose reference range applies only to samples  taken after fasting for at least 8 hours.    Current Facility-Administered Medications  Medication Dose Route Frequency Provider Last Rate Last Admin  . acetaminophen (TYLENOL) tablet 650 mg  650 mg Oral Q4H PRN Gilles Chiquito, MD      . atorvastatin (LIPITOR) tablet 40 mg  40 mg Oral QHS Gilles Chiquito, MD   40 mg at 10/21/20 2328  . brimonidine (ALPHAGAN) 0.2 % ophthalmic solution 1 drop  1 drop Both Eyes TID Gilles Chiquito, MD   1 drop at 10/22/20 0918  . dorzolamide-timolol (COSOPT) 22.3-6.8 MG/ML ophthalmic solution 1 drop  1 drop Both Eyes TID Gilles Chiquito, MD   1 drop at 10/22/20 4098  . lisinopril (ZESTRIL) tablet 20 mg  20 mg Oral Daily Tressie Ellis, RPH   20 mg at 10/22/20 1191   And  . hydrochlorothiazide (HYDRODIURIL) tablet 25 mg  25 mg Oral Daily Tressie Ellis, RPH   25 mg at 10/22/20 4782  . insulin aspart (novoLOG) injection 0-15 Units  0-15 Units Subcutaneous Q4H Gilles Chiquito, MD   11 Units at 10/22/20 1312  . latanoprost (XALATAN) 0.005 % ophthalmic solution 1 drop  1 drop Both Eyes QHS Gilles Chiquito, MD   1 drop at 10/21/20 2329  . pantoprazole (PROTONIX) EC tablet 40 mg  40 mg Oral Daily Gilles Chiquito, MD   40 mg at 10/22/20 0917  . tamsulosin (FLOMAX) capsule 0.4 mg  0.4 mg Oral Daily Gilles Chiquito, MD   0.4 mg at 10/22/20 9562   Current Outpatient Medications  Medication Sig Dispense Refill  . atorvastatin (LIPITOR) 40 MG tablet Take 1 tablet (40 mg total) by mouth at bedtime. 30 tablet 1  . BAYER ASPIRIN EC LOW DOSE 81 MG EC tablet Take by mouth.    . brimonidine (ALPHAGAN) 0.2 % ophthalmic solution Place 1 drop into both eyes 3 (three) times daily.    . dorzolamide-timolol (COSOPT) 22.3-6.8 MG/ML ophthalmic solution Place 1 drop into both eyes 3 (three) times daily. 10 mL 1  .  Insulin Glargine (BASAGLAR KWIKPEN) 100 UNIT/ML SOPN Inject 0.2 mLs (20 Units total) into the skin 2 (two) times daily. 20 mL 2  . insulin lispro (ADMELOG) 100 UNIT/ML injection Inject 0.2 mLs (20 Units total) into the skin 3 (three) times daily before meals. (according to sliding scale) 10 mL 2  . latanoprost (XALATAN) 0.005 % ophthalmic solution Place 1 drop into both eyes at bedtime.    Marland Kitchen lisinopril-hydrochlorothiazide (ZESTORETIC) 20-25 MG tablet Take 1 tablet by mouth daily. 30 tablet 2  . NOVOLOG FLEXPEN 100 UNIT/ML FlexPen Inject into the skin.    Marland Kitchen omeprazole (PRILOSEC) 20 MG capsule Take 1 capsule (20 mg total) by mouth daily. 30 capsule 1  . tamsulosin (FLOMAX) 0.4 MG CAPS capsule Take 1 capsule (0.4 mg total) by mouth daily. 30 capsule 2  . terbinafine (LAMISIL) 250 MG tablet Take by mouth.      Musculoskeletal: Strength & Muscle Tone: within normal limits Gait & Station: normal Patient leans: N/A            Psychiatric Specialty Exam:  Presentation  General Appearance: No data recorded Eye Contact:No data recorded Speech:No data recorded Speech Volume:No data recorded Handedness:No data recorded  Mood and Affect  Mood:No data recorded Affect:No data recorded  Thought Process  Thought Processes:No data recorded Descriptions of Associations:No data recorded Orientation:No data recorded Thought Content:No data recorded History of Schizophrenia/Schizoaffective disorder:No  Duration of Psychotic Symptoms:No data recorded Hallucinations:No data recorded Ideas of Reference:No data recorded Suicidal Thoughts:No data recorded Homicidal Thoughts:No data recorded  Sensorium  Memory:No data recorded Judgment:No data recorded Insight:No data recorded  Executive Functions  Concentration:No data recorded Attention Span:No data recorded Recall:No data recorded Fund of Knowledge:No data recorded Language:No data recorded  Psychomotor Activity  Psychomotor  Activity:No data recorded  Assets  Assets:No data recorded  Sleep  Sleep:No data recorded  Physical Exam: Physical Exam Vitals and nursing note reviewed.  Constitutional:      Appearance: Normal appearance.  HENT:     Head: Normocephalic and atraumatic.     Mouth/Throat:     Pharynx: Oropharynx is clear.  Eyes:     Pupils: Pupils are equal, round, and reactive to light.  Cardiovascular:     Rate and Rhythm: Normal rate and regular rhythm.  Pulmonary:     Effort: Pulmonary effort is normal.     Breath sounds: Normal breath sounds.  Abdominal:     General: Abdomen is flat.     Palpations: Abdomen is soft.  Musculoskeletal:        General: Normal range of motion.  Skin:    General: Skin is warm and dry.  Neurological:     General: No focal deficit present.     Mental Status: He is alert. Mental status is at baseline.  Psychiatric:        Attention and Perception: He is inattentive.        Mood and Affect: Mood normal. Affect is blunt.        Speech: Speech is delayed.        Behavior: Behavior is slowed.        Thought Content: Thought content normal. Thought content is not paranoid. Thought content does not include homicidal or suicidal ideation.        Cognition and Memory: Memory is impaired.        Judgment: Judgment normal.    Review of Systems  Constitutional: Negative.   HENT: Negative.   Eyes: Negative.   Respiratory: Negative.   Cardiovascular: Negative.   Gastrointestinal: Negative.   Musculoskeletal: Negative.   Skin: Negative.   Neurological: Negative.   Psychiatric/Behavioral: Positive for depression and memory loss. Negative for hallucinations, substance abuse and suicidal ideas. The patient is nervous/anxious and has insomnia.    Blood pressure 102/60, pulse 90, temperature 97.8 F (36.6 C), temperature source Oral, resp. rate 17, height 6\' 1"  (1.854 m), weight 95.3 kg, SpO2 98 %. Body mass index is 27.71 kg/m.  Treatment Plan Summary: Plan  Patient who seems to have a somewhat unclear treatment and social status but at least it seems clear that he has a safe place to reside and that he and his "caretaker" are aware of his medical issues.  He is denying suicidal ideation and does not meet commitment criteria and does not want to be admitted to the psychiatric service.  Given current uncertainty about outpatient treatment I will not be starting any medicine but have strongly encouraged the patient and his caretaker that he get follow-up care for his multiple medical problems and for depression and that if suicidal ideation occurs or illness worsens he come in for treatment.  They expressed agreement.  Case reviewed with emergency room physician.  Disposition: No evidence of imminent risk to self or others at present.   Patient does not meet criteria for psychiatric inpatient admission. Supportive therapy provided about ongoing stressors.  Jaelynn Currier,  MD 10/22/2020 1:59 PM

## 2020-10-22 NOTE — ED Provider Notes (Signed)
-----------------------------------------   1:50 PM on 10/22/2020 -----------------------------------------  The patient has been evaluated by Dr. Toni Amend from psychiatry who has cleared him for discharge.  The patient wants to go home.  He had already been medically cleared.  Return precautions provided.   Dionne Bucy, MD 10/22/20 1350

## 2020-10-22 NOTE — ED Notes (Signed)
Assumed care of pt, assisting RN medicated per MAR. Pt resting in room at this time. Wife was in lobby stating the patient was telling her he didn't want to stay. However patient has not used phone since present int he [place]. Wife stating "you are kidnapping my husband." MD Larinda Buttery and Raquel, RN spoke with patient who once again confirmed he voluntarily wanted to stay for a psychiatric evaluation. Up ad lib to restroom. Breathing is regular and unlabored.

## 2020-10-22 NOTE — Progress Notes (Signed)
Inpatient Diabetes Program Recommendations  AACE/ADA: New Consensus Statement on Inpatient Glycemic Control   Target Ranges:  Prepandial:   less than 140 mg/dL      Peak postprandial:   less than 180 mg/dL (1-2 hours)      Critically ill patients:  140 - 180 mg/dL  Results for Lawrence Buchanan, Lawrence Buchanan (MRN [id number]) as of 10/22/2020 10:24  Ref. Range 10/21/2020 16:20 10/21/2020 21:00 10/21/2020 23:44 10/22/2020 01:38 10/22/2020 04:56 10/22/2020 07:49  Glucose-Capillary Latest Ref Range: 70 - 99 mg/dL 947 (H) 096 (H) 283 (H) 226 (H) 214 (H) 217 (H)    Review of Glycemic Control  Diabetes history: DM Outpatient Diabetes medications: Basaglar 20 units BID, Admelog 20 units TID with meals Current orders for Inpatient glycemic control: Novolog 0-15 units Q4H  Inpatient Diabetes Program Recommendations:    Insulin: Please consider ordering Lantus 24 units Q24H (based on 95.3 kg x 0.25 units).  NOTE: In reviewing chart, noted patient seen Dr. Gershon Crane (Endocrinologist) on 01/30/20 for initial consult and no other visits noted since then in Care Everywhere. Per office visit note on 01/30/20, patient was not checking glucose or taking insulin consistently. Patient was instructed to take Basaglar 50 units daily, Novolog or Ademelog 5 units with meals plus additional units for correction based on glucose. Patient presented to Emergency Room on 10/21/20 due to abdominal pain and depression. Per Dr. Michaelle Copas note on 10/21/20, "patient states he also feels extremely depressed and is "ready to leave".  He denies taking any substance to harm herself or active suicidal plans." Patient in ED and voluntarily wanted to stay for a psychiatric evaluation.   Thanks, Orlando Penner, RN, MSN, CDE Diabetes Coordinator Inpatient Diabetes Program [phone] (Team Pager from 8am to 5pm)

## 2020-10-22 NOTE — ED Notes (Addendum)
The patient's niece called to speak with patient prior to phone hours.  After 9 am the patient was asked if he wanted to call his niece back. The patient declined.   The niece called again and RN explained the patient as not wishing to speak with anyone at this time.  RN explained the patient will be taken care of but this writer cannot disclose any further information.  Niece made aware of visitation rules and hours.   Byrd Hesselbach (niece) [phone]. [phone]. [phone]

## 2020-11-19 DIAGNOSIS — E785 Hyperlipidemia, unspecified: Secondary | ICD-10-CM | POA: Insufficient documentation

## 2020-11-19 DIAGNOSIS — I10 Essential (primary) hypertension: Secondary | ICD-10-CM | POA: Insufficient documentation

## 2020-11-27 ENCOUNTER — Ambulatory Visit: Payer: Medicaid Other | Admitting: Podiatry

## 2022-02-11 IMAGING — CT CT ABD-PELV W/O CM
2 of 4 series · 15 of 46 positions shown, 17 images · non-contrast
Comparison: None.

CLINICAL DATA: Reported seizures, flushing abdomen, history of type
1 diabetes

EXAM:
CT ABDOMEN AND PELVIS WITHOUT CONTRAST
TECHNIQUE: Multidetector CT imaging of the abdomen and pelvis was performed
following the standard protocol without IV contrast.

[Series 2: routine abd/pel wo · axial · 0.84mm/px · z∈[-876,-386]mm · 12 of 108 slices shown, 14 images]
[im 5/108  soft-tissue]
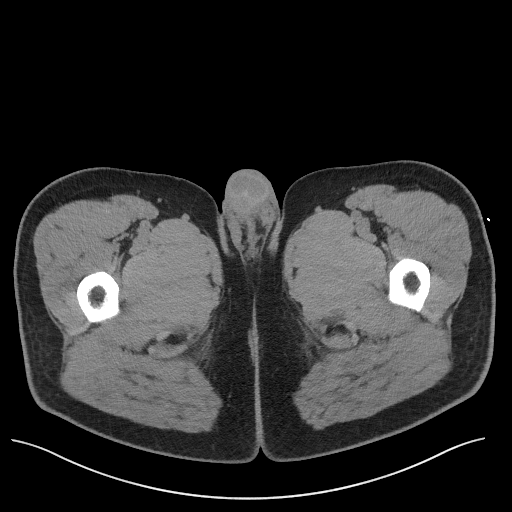
[im 5/108  bone]
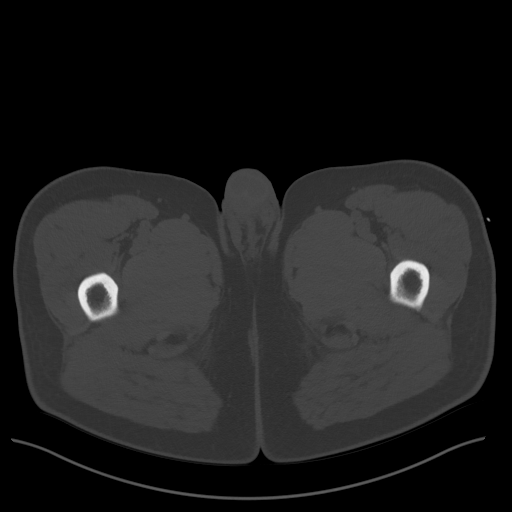
[im 14/108  soft-tissue]
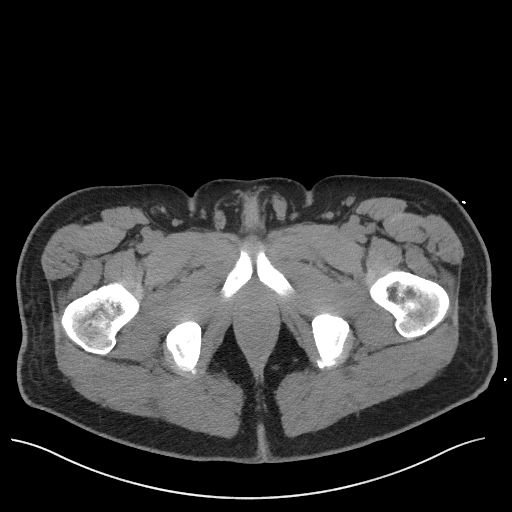
[im 23/108  soft-tissue]
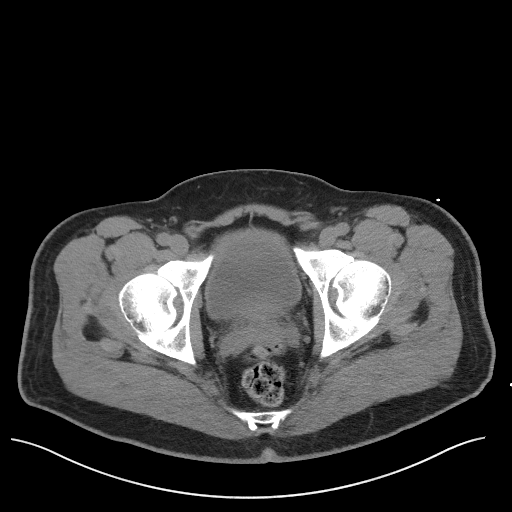
[im 32/108  soft-tissue]
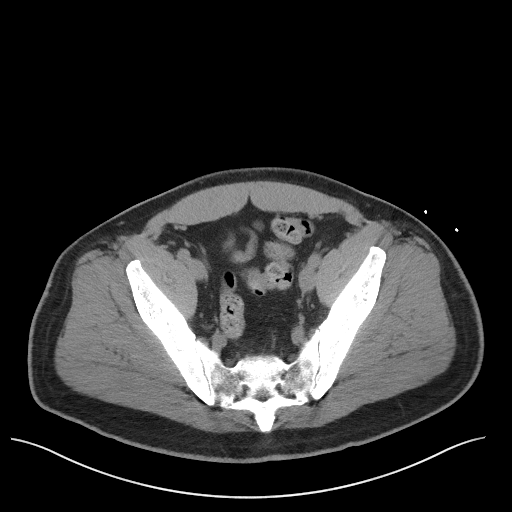
[im 41/108  soft-tissue]
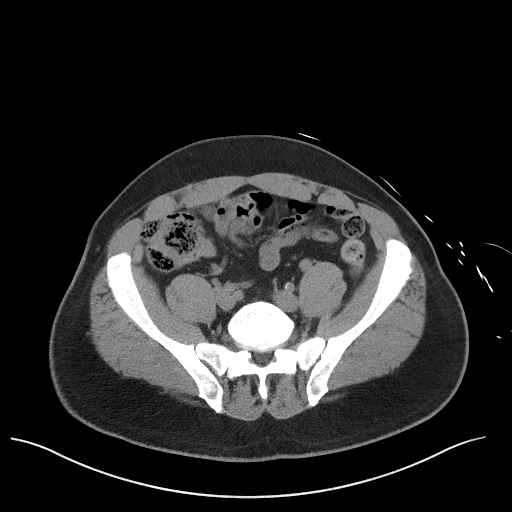
[im 50/108  soft-tissue]
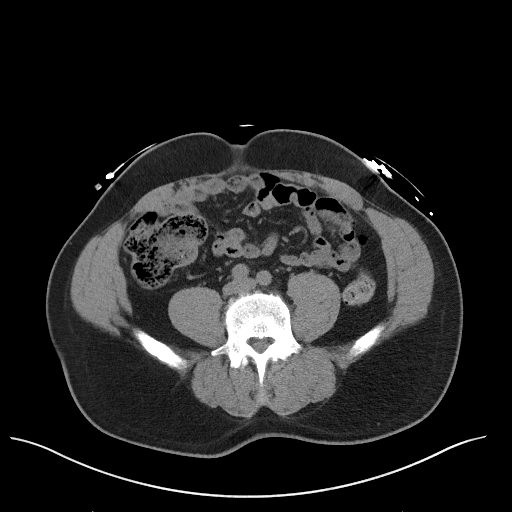
[im 58/108  soft-tissue]
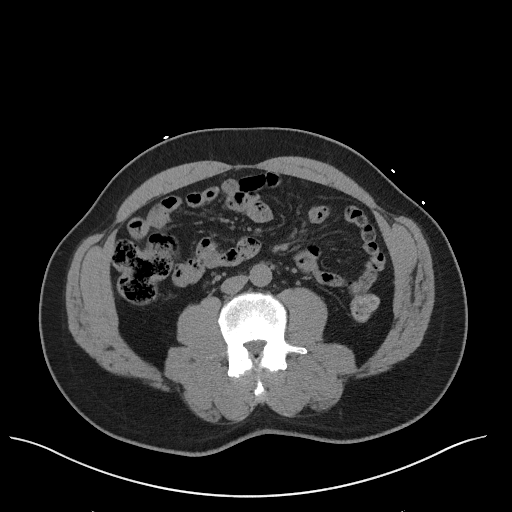
[im 67/108  soft-tissue]
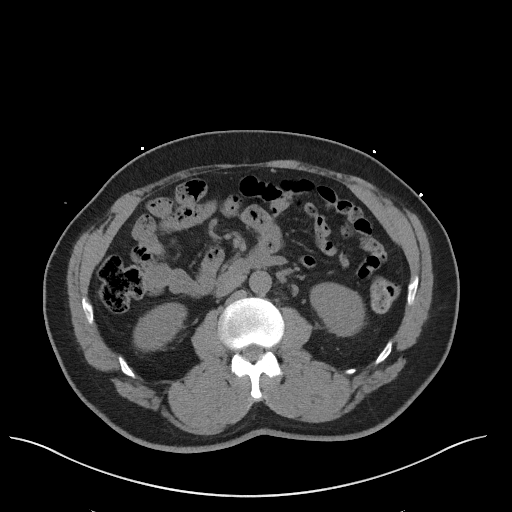
[im 76/108  soft-tissue]
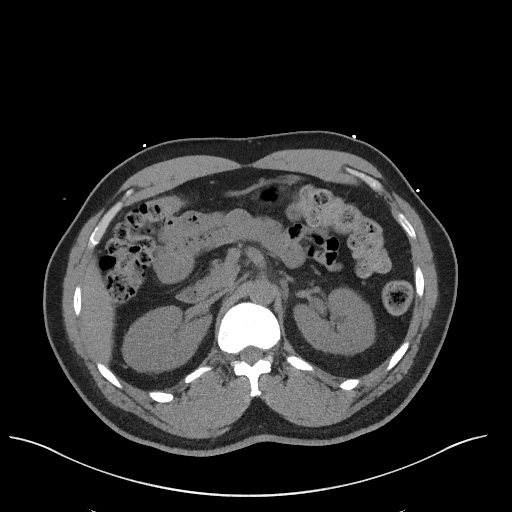
[im 76/108  bone]
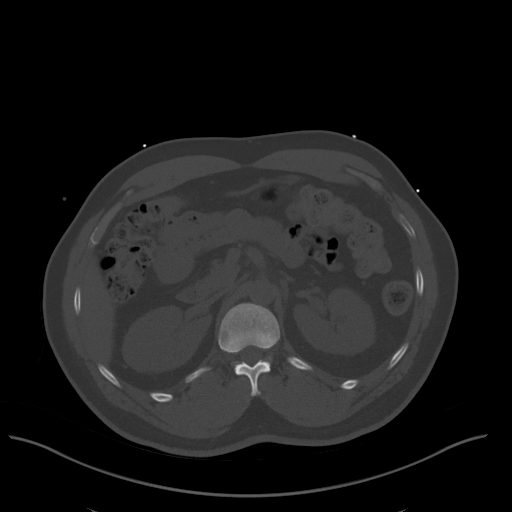
[im 85/108  soft-tissue]
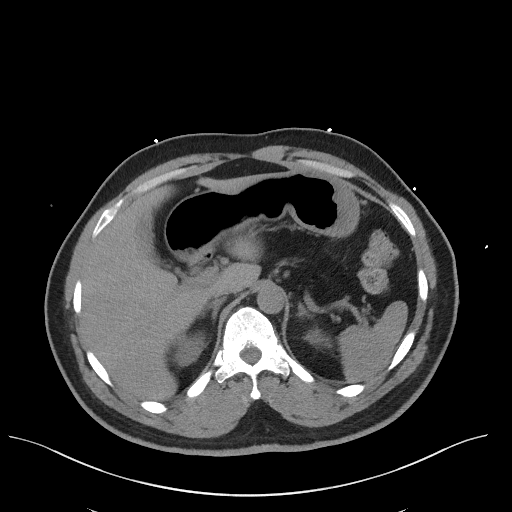
[im 94/108  soft-tissue]
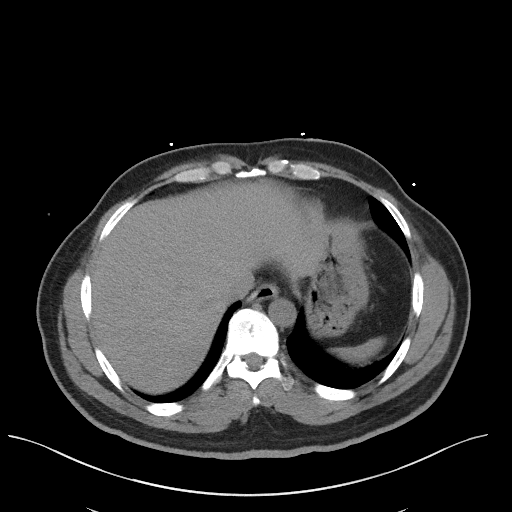
[im 103/108  soft-tissue]
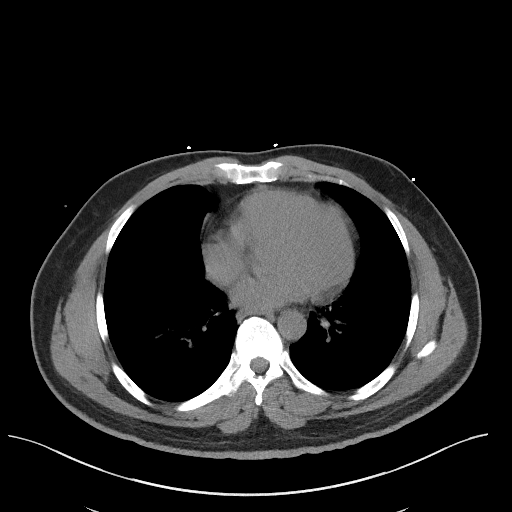

[Series 5: coronal st · coronal · 0.84mm/px · 3 of 95 slices shown]
[im 32/95  soft-tissue]
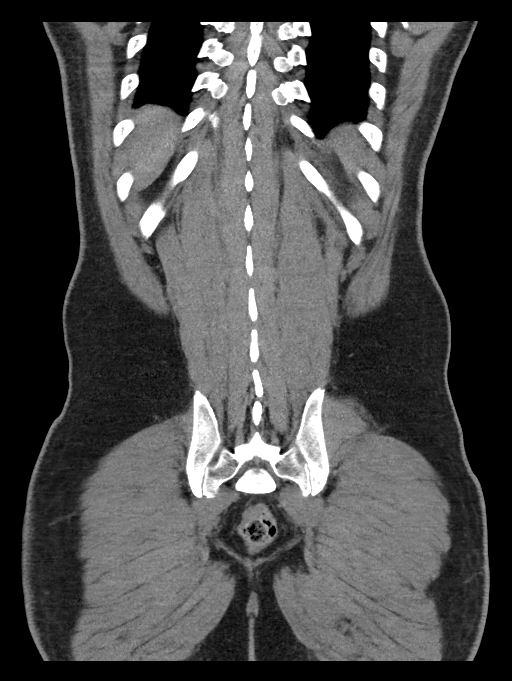
[im 42/95  soft-tissue]
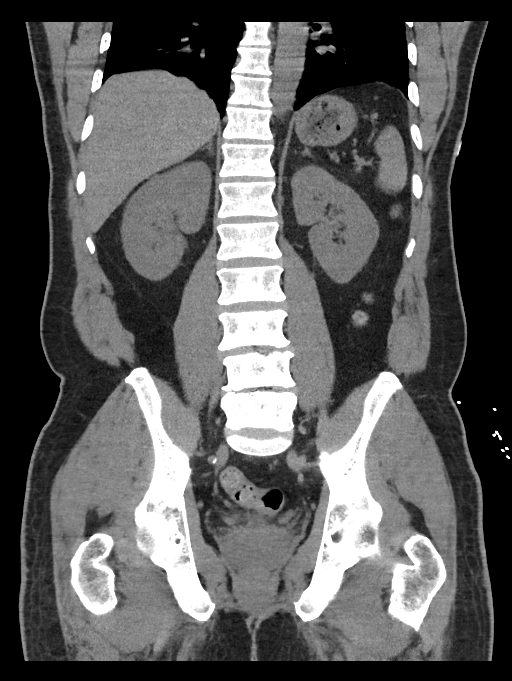
[im 53/95  soft-tissue]
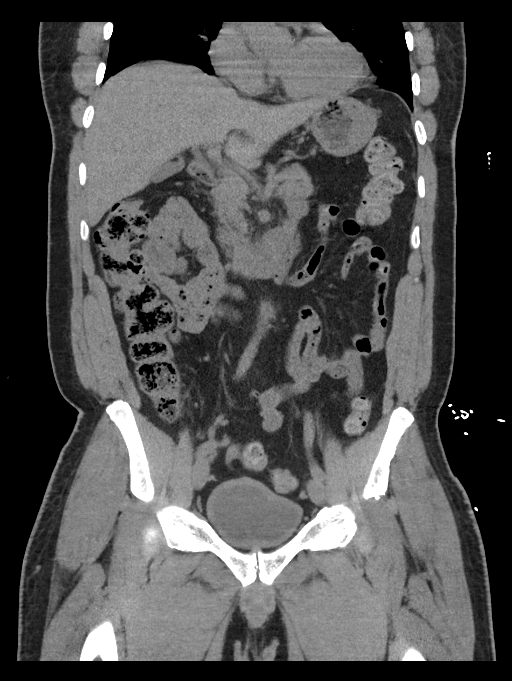

[15 of 46 positions shown; findings below may reference images not displayed]

FINDINGS: Lower chest: Lung bases are clear. Normal heart size. No pericardial
effusion.

Hepatobiliary: No worrisome focal liver abnormality is seen. Normal
gallbladder. No visible calcified gallstones. No biliary ductal
dilatation.

Pancreas: No pancreatic ductal dilatation or surrounding
inflammatory changes.

Spleen: Normal in size. No concerning splenic lesions.

Adrenals/Urinary Tract: Normal adrenals. Kidneys are symmetric in
size and normally located. Nonobstructive 3 mm calculus in the upper
pole left kidney (5/60). No obstructive urolithiasis or
hydronephrosis. No visible or contour deforming renal lesions.
Circumferential bladder wall thickening. No visible bladder calculi
or debris. Indentation of bladder base by an enlarged prostate.

Stomach/Bowel: Distal esophagus, stomach and duodenal sweep are
unremarkable. No small bowel wall thickening or dilatation. No
evidence of obstruction. A normal appendix is visualized. No colonic
dilatation or wall thickening.

Vascular/Lymphatic: Atherosclerotic calcifications within the
abdominal aorta and branch vessels. No aneurysm or ectasia. No
enlarged abdominopelvic lymph nodes.

Reproductive: Mild prostatomegaly with few benign-appearing
eccentric calcifications. Seminal vesicles are unremarkable.
Included external genitalia are free of acute abnormality.

Other: No abdominopelvic free fluid or free gas. No bowel containing
hernias. Tiny fat containing umbilical hernia.

Musculoskeletal: Multilevel degenerative changes are present in the
imaged portions of the spine. Stepwise retrolisthesis L3-L5 without
spondylolysis. Musculature is normal and symmetric.
IMPRESSION: 1. Circumferential bladder wall thickening, possibly related to
outlet obstruction given enlarged prostate with indentation of the
bladder base. Though recommend correlation with urinalysis to
exclude a cystitis.
2. Punctate nonobstructing calculus in the upper pole left kidney.
3. No other acute CT evident abdominopelvic abnormality.
